# Patient Record
Sex: Female | Born: 1970
Health system: Southern US, Community
[De-identification: ages and names within clinical notes are randomized; demographics above are authoritative.]

## PROBLEM LIST (undated history)

## (undated) DIAGNOSIS — K589 Irritable bowel syndrome without diarrhea: Secondary | ICD-10-CM

## (undated) DIAGNOSIS — K219 Gastro-esophageal reflux disease without esophagitis: Secondary | ICD-10-CM

## (undated) DIAGNOSIS — O24419 Gestational diabetes mellitus in pregnancy, unspecified control: Secondary | ICD-10-CM

## (undated) DIAGNOSIS — N63 Unspecified lump in unspecified breast: Secondary | ICD-10-CM

## (undated) HISTORY — PX: CHOLECYSTECTOMY: SHX55

## (undated) HISTORY — PX: WISDOM TOOTH EXTRACTION: SHX21

## (undated) HISTORY — PX: OTHER SURGICAL HISTORY: SHX169

## (undated) HISTORY — DX: Gestational diabetes mellitus in pregnancy, unspecified control: O24.419

## (undated) HISTORY — DX: Unspecified lump in unspecified breast: N63.0

## (undated) HISTORY — DX: Gastro-esophageal reflux disease without esophagitis: K21.9

## (undated) HISTORY — DX: Irritable bowel syndrome, unspecified: K58.9

---

## 2006-10-02 ENCOUNTER — Ambulatory Visit (HOSPITAL_COMMUNITY): Admission: RE | Admit: 2006-10-02 | Discharge: 2006-10-02 | Payer: Self-pay | Admitting: Unknown Physician Specialty

## 2009-06-16 ENCOUNTER — Other Ambulatory Visit: Admission: RE | Admit: 2009-06-16 | Discharge: 2009-06-16 | Payer: Self-pay | Admitting: Obstetrics and Gynecology

## 2011-06-07 ENCOUNTER — Other Ambulatory Visit: Payer: Self-pay | Admitting: Adult Health

## 2011-06-07 ENCOUNTER — Other Ambulatory Visit (HOSPITAL_COMMUNITY)
Admission: RE | Admit: 2011-06-07 | Discharge: 2011-06-07 | Disposition: A | Discharge status: Home / Self Care | Payer: BC Managed Care – PPO | Source: Ambulatory Visit | Attending: Obstetrics and Gynecology | Admitting: Obstetrics and Gynecology

## 2011-06-07 DIAGNOSIS — Z01419 Encounter for gynecological examination (general) (routine) without abnormal findings: Secondary | ICD-10-CM | POA: Insufficient documentation

## 2011-06-28 ENCOUNTER — Other Ambulatory Visit: Payer: Self-pay | Admitting: Adult Health

## 2011-06-28 ENCOUNTER — Ambulatory Visit (HOSPITAL_COMMUNITY)
Admission: RE | Admit: 2011-06-28 | Discharge: 2011-06-28 | Disposition: A | Discharge status: Home / Self Care | Payer: BC Managed Care – PPO | Source: Ambulatory Visit | Attending: Adult Health | Admitting: Adult Health

## 2011-06-28 DIAGNOSIS — N63 Unspecified lump in unspecified breast: Secondary | ICD-10-CM | POA: Insufficient documentation

## 2012-07-23 ENCOUNTER — Encounter: Payer: Self-pay | Admitting: *Deleted

## 2012-07-23 DIAGNOSIS — N63 Unspecified lump in unspecified breast: Secondary | ICD-10-CM | POA: Insufficient documentation

## 2012-07-23 DIAGNOSIS — O24419 Gestational diabetes mellitus in pregnancy, unspecified control: Secondary | ICD-10-CM | POA: Insufficient documentation

## 2012-07-24 ENCOUNTER — Ambulatory Visit: Payer: BC Managed Care – PPO | Admitting: Advanced Practice Midwife

## 2012-07-24 ENCOUNTER — Encounter: Payer: Self-pay | Admitting: Advanced Practice Midwife

## 2012-07-24 NOTE — Progress Notes (Signed)
Abigail Ingram 41 y.o.

## 2013-04-03 HISTORY — PX: ESOPHAGOGASTRODUODENOSCOPY: SHX1529

## 2013-08-12 ENCOUNTER — Other Ambulatory Visit: Payer: Self-pay | Admitting: Gastroenterology

## 2013-08-12 DIAGNOSIS — R109 Unspecified abdominal pain: Secondary | ICD-10-CM

## 2013-08-14 ENCOUNTER — Ambulatory Visit
Admission: RE | Admit: 2013-08-14 | Discharge: 2013-08-14 | Disposition: A | Discharge status: Home / Self Care | Payer: BC Managed Care – PPO | Source: Ambulatory Visit | Attending: Gastroenterology | Admitting: Gastroenterology

## 2013-08-14 DIAGNOSIS — R109 Unspecified abdominal pain: Secondary | ICD-10-CM

## 2013-08-14 MED ORDER — IOHEXOL 300 MG/ML  SOLN
125.0000 mL | Freq: Once | INTRAMUSCULAR | Status: AC | PRN
  Administered 2013-08-14: 125 mL via INTRAVENOUS

## 2014-02-02 ENCOUNTER — Encounter: Payer: Self-pay | Admitting: Advanced Practice Midwife

## 2014-07-30 ENCOUNTER — Encounter: Payer: Self-pay | Admitting: Women's Health

## 2014-07-30 ENCOUNTER — Ambulatory Visit (INDEPENDENT_AMBULATORY_CARE_PROVIDER_SITE_OTHER): Payer: BLUE CROSS/BLUE SHIELD | Admitting: Women's Health

## 2014-07-30 VITALS — BP 120/64 | HR 68 | Wt 188.0 lb

## 2014-07-30 DIAGNOSIS — Z30433 Encounter for removal and reinsertion of intrauterine contraceptive device: Secondary | ICD-10-CM

## 2014-07-30 DIAGNOSIS — Z30014 Encounter for initial prescription of intrauterine contraceptive device: Secondary | ICD-10-CM | POA: Diagnosis not present

## 2014-07-30 DIAGNOSIS — Z3202 Encounter for pregnancy test, result negative: Secondary | ICD-10-CM

## 2014-07-30 LAB — POCT URINE PREGNANCY: PREG TEST UR: NEGATIVE

## 2014-07-30 NOTE — Progress Notes (Signed)
Abigail Ingram is a 44 y.o. year old G2P2 Caucasian female who presents for removal and replacement of a Mirena IUD. She was given informed consent for removal and reinsertion of her Mirena. Her Mirena was placed 06/2009, No LMP recorded. Patient is not currently having periods (Reason: IUD)., and her pregnancy test today was neg.   The risks and benefits of the method and placement have been thouroughly reviewed with the patient and all questions were answered.  Specifically the patient is aware of failure rate of 04/998, expulsion of the IUD and of possible perforation.  The patient is aware of irregular bleeding due to the method and understands the incidence of irregular bleeding diminishes with time.  Signed copy of informed consent in chart.   No LMP recorded. Patient is not currently having periods (Reason: IUD). BP 120/64 mmHg  Pulse 68  Wt 188 lb (85.276 kg) Results for orders placed or performed in visit on 07/30/14 (from the past 24 hour(s))  POCT urine pregnancy   Collection Time: 07/30/14  1:55 PM  Result Value Ref Range   Preg Test, Ur Negative      Appropriate time out taken. A graves speculum was placed in the vagina.  The cervix was visualized, prepped using Betadine. The strings were visible. They were grasped and the Mirena was easily removed. The cervix was then grasped with a single-tooth tenaculum. The uterus was found to be anteroflexed and it sounded to 7 cm.  Liletta IUD placed per manufacturer's recommendations without complications. The strings were trimmed to 3 cm.  The patient tolerated the procedure well.   Sonogram was performed and the proper placement of the IUD was verified via transvaginal u/s.   The patient was given post procedure instructions, including signs and symptoms of infection and to check for the strings after each menses or each month, and refraining from intercourse or anything in the vagina for 3 days.  She was given a [place] care card with  date Liletta placed, and date Liletta  to be removed.  She is scheduled for a f/u appointment in 4 weeks for IUD check/pap & physical.   Marge DuncansBooker, Kimberly Randall CNM, [facility] 07/30/2014 2:23 PM

## 2014-07-30 NOTE — Patient Instructions (Signed)
 Nothing in vagina for 3 days (no sex, douching, tampons, etc...)  Check your strings once a month to make sure you can feel them, if you are not able to please let us know  If you develop a fever of 100.4 or more in the next few weeks, or if you develop severe abdominal pain, please let us know  Use a backup method of birth control, such as condoms, for 2 weeks   Levonorgestrel intrauterine device (IUD) What is this medicine? LEVONORGESTREL IUD (LEE voe nor jes trel) is a contraceptive (birth control) device. The device is placed inside the uterus by a healthcare professional. It is used to prevent pregnancy and can also be used to treat heavy bleeding that occurs during your period. Depending on the device, it can be used for 3 to 5 years. This medicine may be used for other purposes; ask your health care provider or pharmacist if you have questions. COMMON BRAND NAME(S): LILETTA, Mirena, Skyla What should I tell my health care provider before I take this medicine? They need to know if you have any of these conditions: -abnormal Pap smear -cancer of the breast, uterus, or cervix -diabetes -endometritis -genital or pelvic infection now or in the past -have more than one sexual partner or your partner has more than one partner -heart disease -history of an ectopic or tubal pregnancy -immune system problems -IUD in place -liver disease or tumor -problems with blood clots or take blood-thinners -use intravenous drugs -uterus of unusual shape -vaginal bleeding that has not been explained -an unusual or allergic reaction to levonorgestrel, other hormones, silicone, or polyethylene, medicines, foods, dyes, or preservatives -pregnant or trying to get pregnant -breast-feeding How should I use this medicine? This device is placed inside the uterus by a health care professional. Talk to your pediatrician regarding the use of this medicine in children. Special care may be  needed. Overdosage: If you think you have taken too much of this medicine contact a poison control center or emergency room at once. NOTE: This medicine is only for you. Do not share this medicine with others. What if I miss a dose? This does not apply. What may interact with this medicine? Do not take this medicine with any of the following medications: -amprenavir -bosentan -fosamprenavir This medicine may also interact with the following medications: -aprepitant -barbiturate medicines for inducing sleep or treating seizures -bexarotene -griseofulvin -medicines to treat seizures like carbamazepine, ethotoin, felbamate, oxcarbazepine, phenytoin, topiramate -modafinil -pioglitazone -rifabutin -rifampin -rifapentine -some medicines to treat HIV infection like atazanavir, indinavir, lopinavir, nelfinavir, tipranavir, ritonavir -St. John's wort -warfarin This list may not describe all possible interactions. Give your health care provider a list of all the medicines, herbs, non-prescription drugs, or dietary supplements you use. Also tell them if you smoke, drink alcohol, or use illegal drugs. Some items may interact with your medicine. What should I watch for while using this medicine? Visit your doctor or health care professional for regular check ups. See your doctor if you or your partner has sexual contact with others, becomes HIV positive, or gets a sexual transmitted disease. This product does not protect you against HIV infection (AIDS) or other sexually transmitted diseases. You can check the placement of the IUD yourself by reaching up to the top of your vagina with clean fingers to feel the threads. Do not pull on the threads. It is a good habit to check placement after each menstrual period. Call your doctor right away if you feel   more of the IUD than just the threads or if you cannot feel the threads at all. The IUD may come out by itself. You may become pregnant if the device  comes out. If you notice that the IUD has come out use a backup birth control method like condoms and call your health care provider. Using tampons will not change the position of the IUD and are okay to use during your period. What side effects may I notice from receiving this medicine? Side effects that you should report to your doctor or health care professional as soon as possible: -allergic reactions like skin rash, itching or hives, swelling of the face, lips, or tongue -fever, flu-like symptoms -genital sores -high blood pressure -no menstrual period for 6 weeks during use -pain, swelling, warmth in the leg -pelvic pain or tenderness -severe or sudden headache -signs of pregnancy -stomach cramping -sudden shortness of breath -trouble with balance, talking, or walking -unusual vaginal bleeding, discharge -yellowing of the eyes or skin Side effects that usually do not require medical attention (report to your doctor or health care professional if they continue or are bothersome): -acne -breast pain -change in sex drive or performance -changes in weight -cramping, dizziness, or faintness while the device is being inserted -headache -irregular menstrual bleeding within first 3 to 6 months of use -nausea This list may not describe all possible side effects. Call your doctor for medical advice about side effects. You may report side effects to FDA at 1-800-FDA-1088. Where should I keep my medicine? This does not apply. NOTE: This sheet is a summary. It may not cover all possible information. If you have questions about this medicine, talk to your doctor, pharmacist, or health care provider.  2015, Elsevier/Gold Standard. (2011-04-20 13:54:04)  

## 2014-08-12 ENCOUNTER — Encounter: Payer: Self-pay | Admitting: Adult Health

## 2014-08-26 ENCOUNTER — Other Ambulatory Visit (HOSPITAL_COMMUNITY)
Admission: RE | Admit: 2014-08-26 | Discharge: 2014-08-26 | Disposition: A | Discharge status: Home / Self Care | Payer: BLUE CROSS/BLUE SHIELD | Source: Ambulatory Visit | Attending: Obstetrics & Gynecology | Admitting: Obstetrics & Gynecology

## 2014-08-26 ENCOUNTER — Encounter: Payer: Self-pay | Admitting: Women's Health

## 2014-08-26 ENCOUNTER — Ambulatory Visit (INDEPENDENT_AMBULATORY_CARE_PROVIDER_SITE_OTHER): Payer: BLUE CROSS/BLUE SHIELD | Admitting: Women's Health

## 2014-08-26 VITALS — BP 100/70 | HR 72 | Ht 64.4 in | Wt 185.0 lb

## 2014-08-26 DIAGNOSIS — Z01419 Encounter for gynecological examination (general) (routine) without abnormal findings: Secondary | ICD-10-CM

## 2014-08-26 DIAGNOSIS — Z975 Presence of (intrauterine) contraceptive device: Secondary | ICD-10-CM | POA: Insufficient documentation

## 2014-08-26 DIAGNOSIS — Z1151 Encounter for screening for human papillomavirus (HPV): Secondary | ICD-10-CM | POA: Insufficient documentation

## 2014-08-26 NOTE — Addendum Note (Signed)
Addended by: Shawna ClampBOOKER, Kimberley Dastrup R on: 08/26/2014 11:05 AM   Modules accepted: Orders, Medications

## 2014-08-26 NOTE — Progress Notes (Signed)
Subjective:   Abigail Ingram is a 44 y.o. [id number] Caucasian female here for a routine well-woman exam.  No LMP recorded. Patient is not currently having periods (Reason: IUD).    Current complaints: none PCP: [facility]       Does not desire labs, has had 'full work-up' w/in last year  Social History: Sexual: heterosexual Marital Status: married Living situation: with spouse Occupation: EMT Tobacco/alcohol: no tobacco, occ etoh Illicit drugs: no history of illicit drug use  The following portions of the patient's history were reviewed and updated as appropriate: allergies, current medications, past family history, past medical history, past social history, past surgical history and problem list.  Past Medical History Past Medical History  Diagnosis Date  . Gestational diabetes   . Breast nodule     Right   . IBS (irritable bowel syndrome)   . GERD (gastroesophageal reflux disease)     Past Surgical History Past Surgical History  Procedure Laterality Date  . Kidney valve repair     . Cholecystectomy      Gynecologic History G2P2  No LMP recorded. Patient is not currently having periods (Reason: IUD). Contraception: IUD Last Pap: 2013. Results were: normal Last mammogram: 4/26. Results were: normal Last TCS: never  Obstetric History OB History  Gravida Para Term Preterm AB SAB TAB Ectopic Multiple Living  2 2        2     # Outcome Date GA Lbr Len/2nd Weight Sex Delivery Anes PTL Lv  2 Para         Y  1 Para         Y      Current Medications Current Outpatient Prescriptions on File Prior to Visit  Medication Sig Dispense Refill  . Calcium 200 MG TABS Take by mouth.    . esomeprazole (NEXIUM) 20 MG capsule Take 20 mg by mouth daily at 12 noon.    Marland Kitchen. levonorgestrel (MIRENA) 20 MCG/24HR IUD 1 each by Intrauterine route once.     No current facility-administered medications on file prior to visit.    Review of Systems Patient denies any headaches,  blurred vision, shortness of breath, chest pain, abdominal pain, problems with bowel movements, urination, or intercourse.  Objective:  BP 100/70 mmHg  Pulse 72  Ht 5' 4.4" (1.636 m)  Wt 185 lb (83.915 kg)  BMI 31.35 kg/m2 Physical Exam  General:  Well developed, well nourished, no acute distress. She is alert and oriented x3. Skin:  Warm and dry Neck:  Midline trachea, no thyromegaly or nodules Cardiovascular: Regular rate and rhythm, no murmur heard Lungs:  Effort normal, all lung fields clear to auscultation bilaterally Breasts:  No dominant palpable mass, retraction, or nipple discharge Abdomen:  Soft, non tender, no hepatosplenomegaly or masses Pelvic:  External genitalia is normal in appearance.  The vagina is normal in appearance. The cervix is bulbous, no CMT, Liletta IUD strings visible.  Thin prep pap is done w/ HR HPV cotesting. Uterus is felt to be normal size, shape, and contour.  No adnexal masses or tenderness noted. Extremities:  No swelling or varicosities noted Psych:  She has a normal mood and affect  Assessment:   Healthy well-woman exam IUD check- strings visible  Plan:  F/U 765yr for physical, or sooner if needed Mammogram in [age]yr (just had normal in April), or sooner if problems Colonoscopy @ 44yo or sooner if problems  Marge DuncansBooker, Alonza Knisley Randall CNM, [facility]NP[facility] 08/26/2014 10:39 AM

## 2014-08-26 NOTE — Addendum Note (Signed)
Addended by: Richardson ChiquitoRAVIS, Loyal Holzheimer M on: 08/26/2014 11:25 AM   Modules accepted: Orders

## 2014-08-28 LAB — CYTOLOGY - PAP

## 2016-05-06 IMAGING — CT CT ABDOMEN W/ CM
2 of 5 series · 17 of 46 positions shown, 19 images · IV contrast (READICAT/WATER & [ID] OMNI 300)
Comparison: No priors.

CLINICAL DATA: Pain below the ribs bilaterally for the past 5
months increasing in severity. Nausea.

EXAM:
CT ABDOMEN WITH CONTRAST
TECHNIQUE: Multidetector CT imaging of the abdomen was performed using the
standard protocol following bolus administration of intravenous
contrast.
CONTRAST:  125mL OMNIPAQUE IOHEXOL 300 MG/ML  SOLN

[Series 2: abdomen w/ · axial · 0.84mm/px · z∈[-298,-8]mm · 14 of 68 slices shown, 16 images]
[im 5/68  soft-tissue]
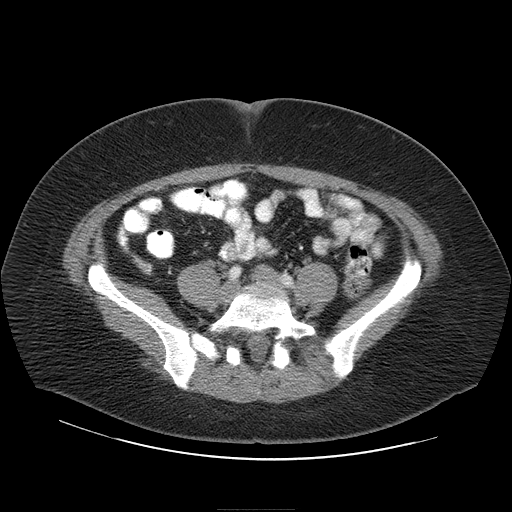
[im 5/68  bone]
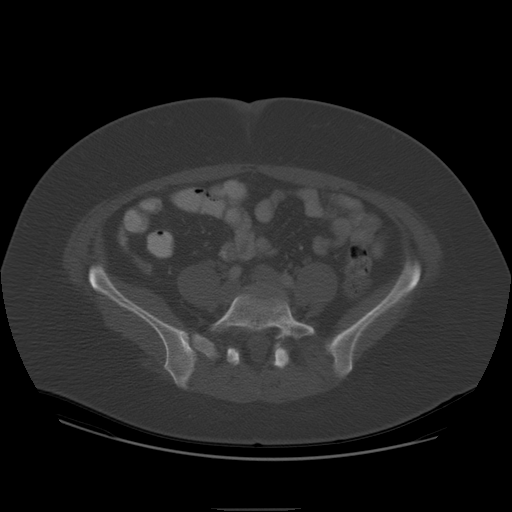
[im 9/68  soft-tissue]
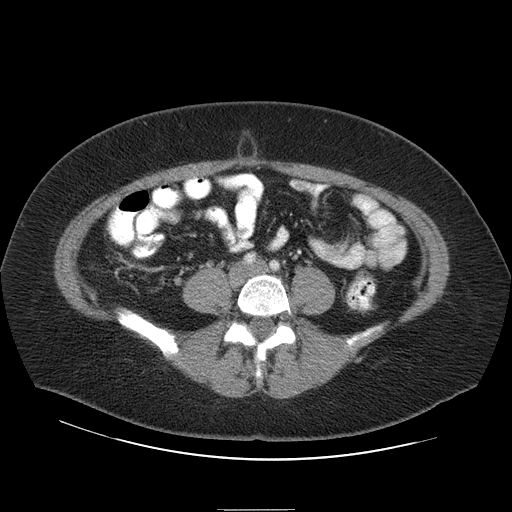
[im 13/68  soft-tissue]
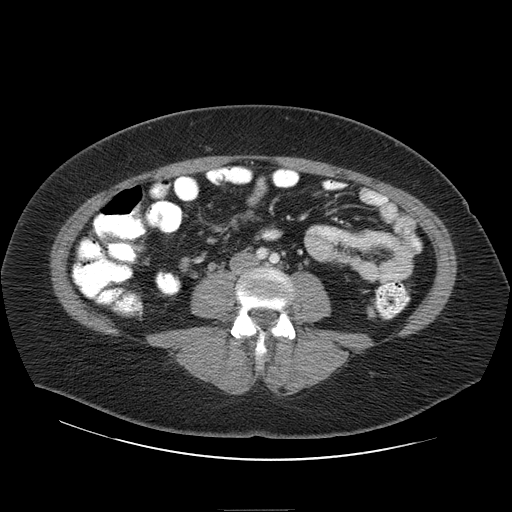
[im 17/68  soft-tissue]
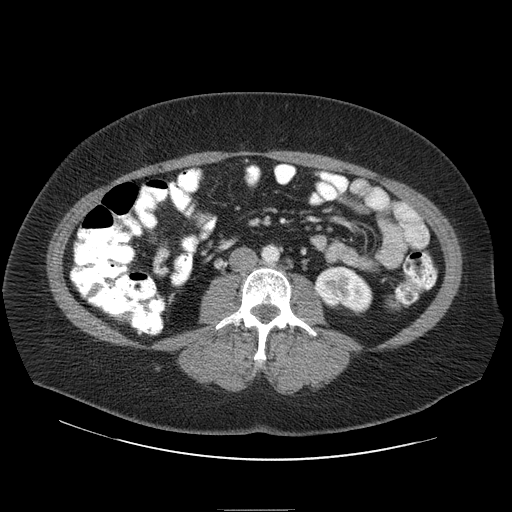
[im 21/68  soft-tissue]
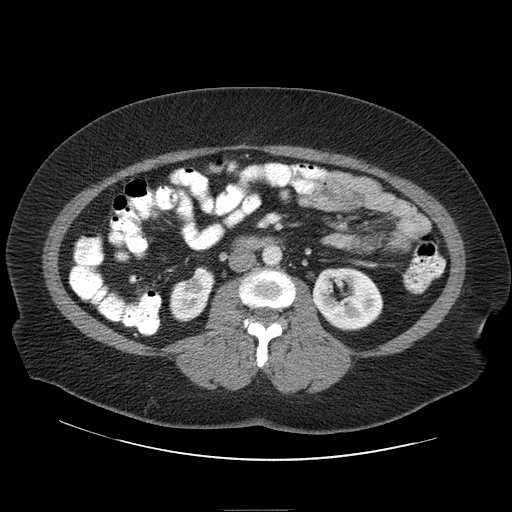
[im 26/68  soft-tissue]
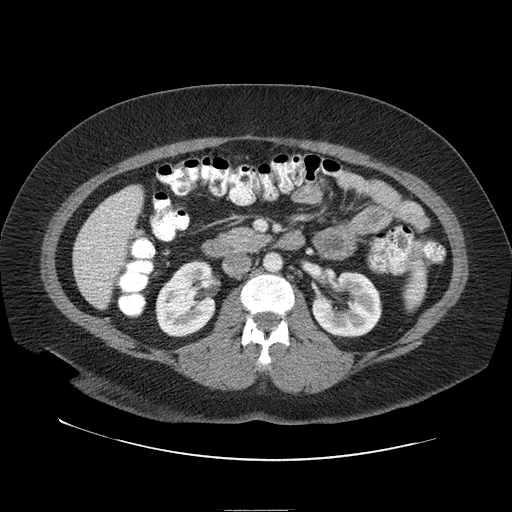
[im 30/68  soft-tissue]
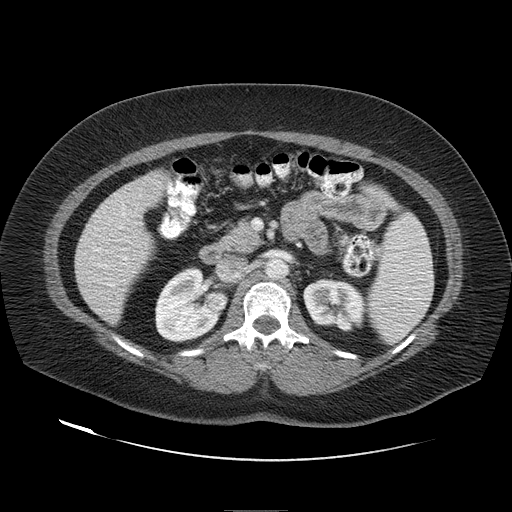
[im 38/68  soft-tissue]
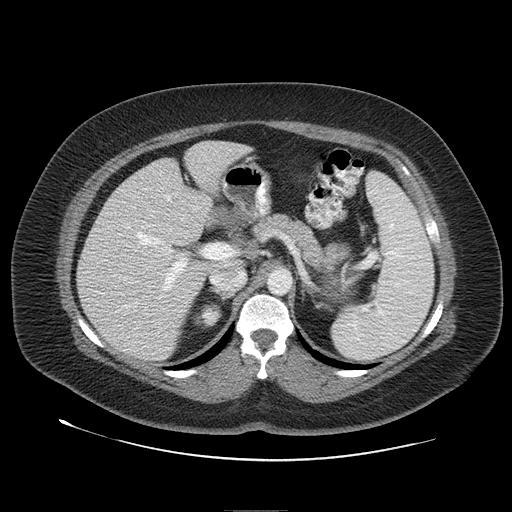
[im 42/68  soft-tissue]
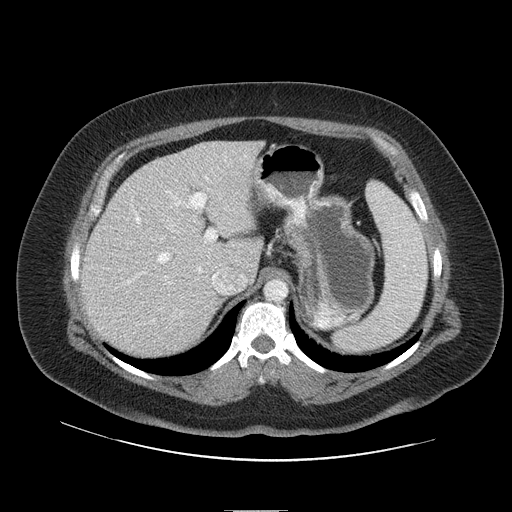
[im 42/68  bone]
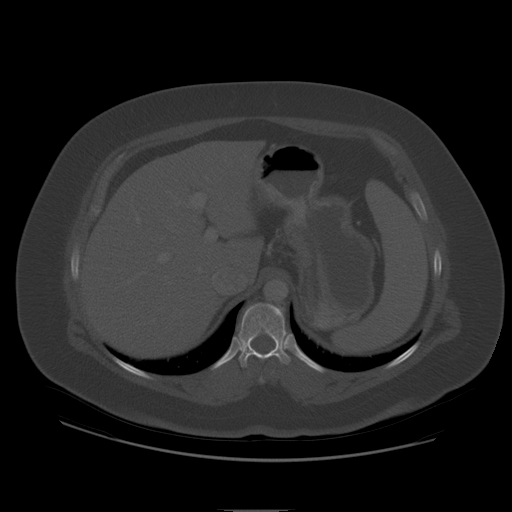
[im 47/68  soft-tissue]
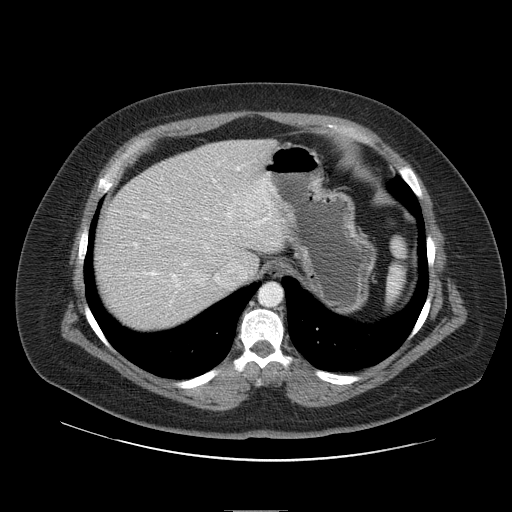
[im 51/68  soft-tissue]
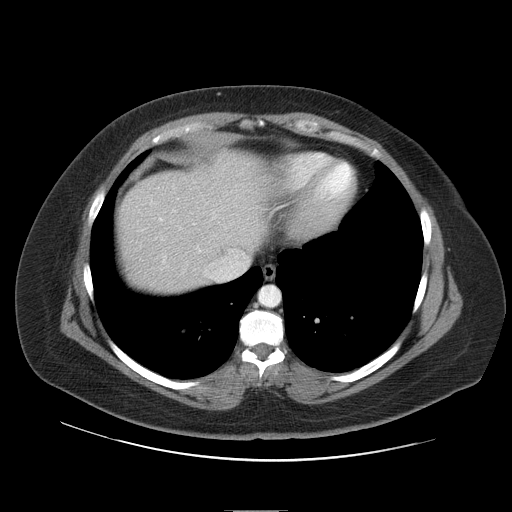
[im 55/68  soft-tissue]
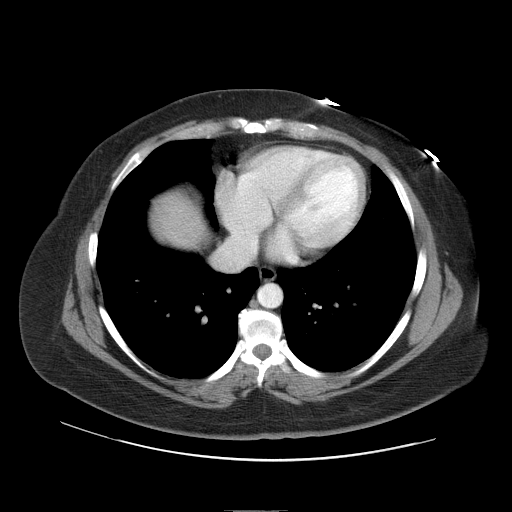
[im 59/68  soft-tissue]
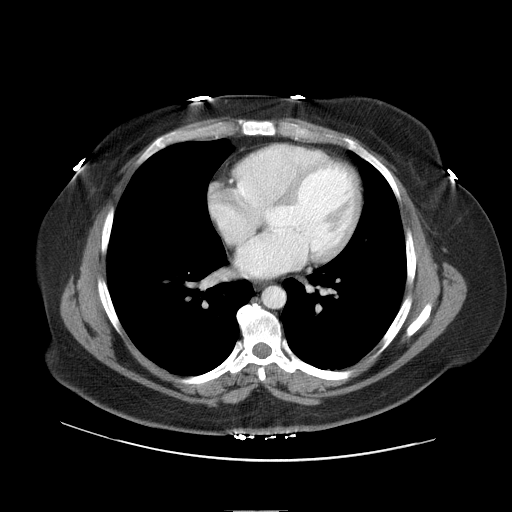
[im 63/68  soft-tissue]
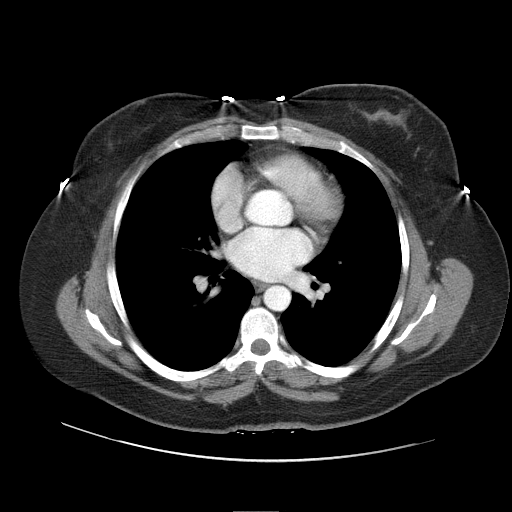

[Series 400: cor · coronal · 0.84mm/px · 3 of 131 slices shown]
[im 44/131  soft-tissue]
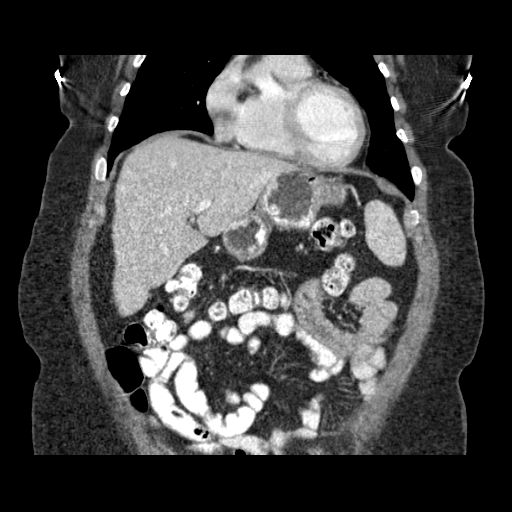
[im 58/131  soft-tissue]
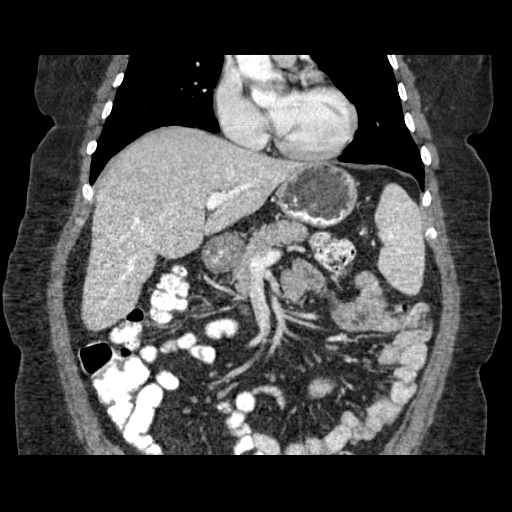
[im 73/131  soft-tissue]
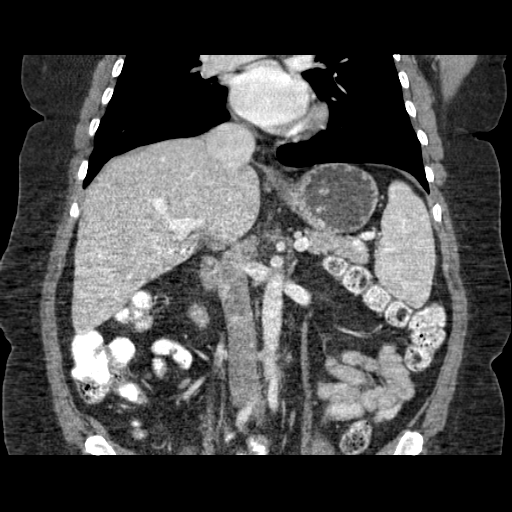

[17 of 46 positions shown; findings below may reference images not displayed]

FINDINGS: Lung Bases: Unremarkable.

Abdomen: Status post cholecystectomy. The appearance of the liver,
pancreas and bilateral adrenal glands is unremarkable. There is some
mild multifocal cortical thinning in the kidneys bilaterally,
presumably scarring from prior infection/inflammation. Spleen is
enlarged measuring 16.3 x 7.8 x 11.6 cm (estimated splenic volume of
737 mL). Normal appendix. Within the visualized portions of the
peritoneal cavity there is no significant volume of ascites, no
pneumoperitoneum and no pathologic distention of small bowel. No
lymphadenopathy.

Musculoskeletal: Bilateral pars defects at L5 with 5 mm of
anterolisthesis of L5 upon S1. There are no aggressive appearing
lytic or blastic lesions noted in the visualized portions of the
skeleton.
IMPRESSION: 1. Splenomegaly, as above.
2. No acute findings to account for the patient's symptoms.
3. Status post cholecystectomy.
4. Normal appendix.
5. Multifocal scarring in the kidneys bilaterally presumably related
to prior episodes of infection/inflammation.
6. Grade 1 spondylolisthesis of L5 upon S1.

## 2016-06-05 ENCOUNTER — Encounter: Payer: Self-pay | Admitting: Gastroenterology

## 2016-06-21 ENCOUNTER — Encounter: Payer: Self-pay | Admitting: Nurse Practitioner

## 2016-06-21 ENCOUNTER — Ambulatory Visit (INDEPENDENT_AMBULATORY_CARE_PROVIDER_SITE_OTHER): Payer: BLUE CROSS/BLUE SHIELD | Admitting: Nurse Practitioner

## 2016-06-21 DIAGNOSIS — R197 Diarrhea, unspecified: Secondary | ICD-10-CM | POA: Diagnosis not present

## 2016-06-21 DIAGNOSIS — R109 Unspecified abdominal pain: Secondary | ICD-10-CM

## 2016-06-21 DIAGNOSIS — K219 Gastro-esophageal reflux disease without esophagitis: Secondary | ICD-10-CM

## 2016-06-21 MED ORDER — DEXLANSOPRAZOLE 60 MG PO CPDR: 60.0000 mg | DELAYED_RELEASE_CAPSULE | Freq: Every day | ORAL | 0 refills | Status: DC

## 2016-06-21 MED ORDER — SUCRALFATE 1 G PO TABS: 1.0000 g | ORAL_TABLET | Freq: Three times a day (TID) | ORAL | 1 refills | Status: DC

## 2016-06-21 NOTE — Assessment & Plan Note (Signed)
The patient has chronic GERD. Was previously on omeprazole 20 mg and omeprazole 40 mg. This is discontinued. She is now on over-the-counter Nexium 40 mg. Per the patient she had an upper endoscopy which showed significant esophagitis. [facility] She started taking Carafate and feels that this is helped significantly and is specifically requesting a refill of Carafate. At this point I will have her stop Nexium, start Dexilant for which I will give her samples. I will send in Carafate per her request. Call if worsening symptoms, return for follow-up in 6 weeks.

## 2016-06-21 NOTE — Assessment & Plan Note (Signed)
The patient notes lower abdominal cramping in addition to diarrhea. This all started in January. She wonders if she has IBS although she has typically been normal stools to constipation for the majority of her life. Her diarrhea seems to be resolving as she is now having soft stools are not watery/loose stools. Abdominal cramping is improving as well. Her symptoms are more likely indicative of a live self-limited viral gastroenteritis rather than ongoing irritable bowel syndrome diarrhea type. I will have her continue her current medications and monitor her symptoms. She is to call us if she has worsening abdominal pain or diarrhea. Return for follow-up in 6 weeks for symptom progression.

## 2016-06-21 NOTE — Patient Instructions (Signed)
1. Stop taking over-the-counter Nexium for now. 2. Start taking Dexilant once a day, on an empty stomach. We will provide you samples to last a couple weeks. 3. Call us in 1-2 weeks and let us know if the Dexilant is helping your acid reflux symptoms. 4. I sent in Carafate to your pharmacy for any breakthrough acid reflux. 5. Call notify us if you have any worsening cramping in her lower abdomen or returning diarrhea. 6. Otherwise, return for follow-up in 6 weeks.

## 2016-06-21 NOTE — Progress Notes (Signed)
cc'ed to pcp °

## 2016-06-21 NOTE — Assessment & Plan Note (Signed)
The patient has epigastric pain, likely due to her GERD and possibly esophagitis. She also has been having some lower abdominal cramping in addition to her diarrhea. Bowel movements initially did not help her cramping. She feels she may have IBS. She is under significant stress. Her symptoms started in early to mid January and at this point seem to be improving. States lower abdominal cramping is significantly better. Return for follow-up in 6 weeks. Call if worsening symptoms. More likely viral gastroenteritis rather than prolonged/ongoing irritable bowel syndrome diarrhea type.

## 2016-06-21 NOTE — Progress Notes (Signed)
Primary Care Physician:  Cassell SmilesFUSCO,LAWRENCE J., MD Primary Gastroenterologist:  Dr. Darrick PennaFields  Chief Complaint  Patient presents with  . Gastroesophageal Reflux  . Abdominal Pain    HPI:   Abigail Ingram is a 46 y.o. female who presents On referral from primary care for GERD and abnormal stools. PCP notes reviewed, last saw primary care on 05/25/2016. At that time the patient noted abdominal pain and heartburn. Had previously been diagnosed with IBS as well and on 04/23/2016 she began having watery diarrhea and lower abdominal pain which continued as of the time of PCP evaluation. She was currently on omeprazole 40 mg daily at that time. Plan included discussing stress management and encouraging more frequent yoga.  Today she states she's doing somewhat better. Symptoms started about 3 years ago in January and attributed to stress. States she has a "nervous stomach." She was having epigastric pain, tested for H. Pylori which was negative. Was on Prilosec and increased it to twice a day. Had EGD completed and [facility] Was told to stop Prilosec and started OTC Nexium, which helped. Has abdominal pain near umbilicus and MRI was completed and told it was normal, diagnosed with IBS. Was started on a medication which cause severe constipation. She made dietary changes including limiting caffeine, fatty foods, spicy foods; avoids processed foods (which she feels typically causes exacerbation of her symptoms.) Symptoms were doing well, other than when stressed. Early January began with water diarrhea, took Immodium 1-2 doses. This diarrhea was associated with worsening lower abdominal crampy pain. Has been having crampy pain with significant and large amount of soft to loose stools.   Started Carafate recently (took her father's Carafate) and has done somewhat better. Past couple days has had more of a normal bowel movement.  Typically has had more constipation in her life than  diarrhea.  Past Medical History:  Diagnosis Date  . Breast nodule    Right   . GERD (gastroesophageal reflux disease)   . Gestational diabetes   . IBS (irritable bowel syndrome)     Past Surgical History:  Procedure Laterality Date  . CHOLECYSTECTOMY    . ESOPHAGOGASTRODUODENOSCOPY    . kidney valve repair       Current Outpatient Prescriptions  Medication Sig Dispense Refill  . Calcium 200 MG TABS Take by mouth.    . esomeprazole (NEXIUM) 20 MG capsule Take 20 mg by mouth daily at 12 noon.     No current facility-administered medications for this visit.     Allergies as of 06/21/2016  . (No Known Allergies)    Family History  Problem Relation Age of Onset  . Cancer Other     breast  . Deep vein thrombosis Mother     + for inherited clotting disorder, on coumadin  . Cancer Father     skin  . Cancer Sister     skin  . Cancer Maternal Grandmother     colon   . Cancer Paternal Grandmother     skin  . Diabetes Other   . Hypertension Other   . Stroke Other   . Mental illness Other     Social History   Social History  . Marital status: Married    Spouse name: N/A  . Number of children: N/A  . Years of education: N/A   Occupational History  . Not on file.   Social History Main Topics  . Smoking status: Never Smoker  . Smokeless tobacco:  Not on file  . Alcohol use Yes     Comment: ocassional  . Drug use: No  . Sexual activity: Yes    Birth control/ protection: IUD   Other Topics Concern  . Not on file   Social History Narrative  . No narrative on file    Review of Systems: Complete ROS negative except as per HPI.   Physical Exam: BP 130/79   Pulse (!) 58   Temp 98.1 F (36.7 C) (Oral)   Ht 5\' 4"  (1.626 m)   Wt 192 lb 12.8 oz (87.5 kg)   BMI 33.09 kg/m  General:   Overweight female. Alert and oriented. Pleasant and cooperative. Well-nourished and well-developed.  Head:  Normocephalic and atraumatic. Eyes:  Without icterus, sclera  clear and conjunctiva pink.  Ears:  Normal auditory acuity. Cardiovascular:  S1, S2 present without murmurs appreciated. Normal pulses noted. Extremities without clubbing or edema. Respiratory:  Clear to auscultation bilaterally. No wheezes, rales, or rhonchi. No distress.  Gastrointestinal:  +BS, soft, non-tender and non-distended. No HSM noted. No guarding or rebound. No masses appreciated.  Rectal:  Deferred  Musculoskalatal:  Symmetrical without gross deformities. Skin:  Intact without significant lesions or rashes. Neurologic:  Alert and oriented x4;  grossly normal neurologically. Psych:  Alert and cooperative. Normal mood and affect. Heme/Lymph/Immune: No excessive bruising noted.    06/21/2016 11:25 AM   Disclaimer: This note was dictated with voice recognition software. Similar sounding words can inadvertently be transcribed and may not be corrected upon review.

## 2016-06-26 ENCOUNTER — Encounter: Payer: Self-pay | Admitting: Women's Health

## 2016-06-26 ENCOUNTER — Ambulatory Visit (INDEPENDENT_AMBULATORY_CARE_PROVIDER_SITE_OTHER): Payer: BLUE CROSS/BLUE SHIELD | Admitting: Women's Health

## 2016-06-26 VITALS — BP 122/64 | HR 60 | Ht 64.25 in | Wt 194.0 lb

## 2016-06-26 DIAGNOSIS — Z01419 Encounter for gynecological examination (general) (routine) without abnormal findings: Secondary | ICD-10-CM
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
## 2016-06-26 NOTE — Patient Instructions (Signed)
Tips to Help You Sleep Better:  ?Get into a bedtime routine, try to do the same thing every night before going to bed to try to help your body wind down ?Warm baths ?Avoid caffeine for at least 3 hours before going to sleep  ?Keep your room at a slightly cooler temperature, can try running a fan ?Turn off TV, lights, phone, electronics ?Lots of pillows if needed to help you get comfortable ?Lavender scented items can help you sleep. You can place lavender essential oil on a cotton ball and place under your pillowcase, or place in a diffuser. Febreeze has a lavender scented sleep line (plug-ins, sprays, etc). Look in the pillow aisle for lavender scented pillows.  ?If none of the above things help, you can try 1/2 to 1 tablet of benadryl, unisom, or tylenol pm. Do not take this every night, only when you really need it.   ?

## 2016-07-19 NOTE — Progress Notes (Signed)
REVIEWED-NO ADDITIONAL RECOMMENDATIONS. 

## 2016-07-27 ENCOUNTER — Telehealth: Payer: Self-pay | Admitting: Gastroenterology

## 2016-07-27 NOTE — Telephone Encounter (Signed)
Noted - no further recommendations at this time.

## 2016-07-27 NOTE — Telephone Encounter (Signed)
PATIENT CALLED WITH HER UPDATE AND SHE IS DOING OK AND HAS CUT BACK ON NEXIUM AND TAKING CARAFATE 1-2 TIMES A DAY

## 2016-08-03 ENCOUNTER — Ambulatory Visit: Payer: BLUE CROSS/BLUE SHIELD | Admitting: Nurse Practitioner

## 2016-09-05 ENCOUNTER — Encounter: Payer: Self-pay | Admitting: Nurse Practitioner

## 2016-09-05 ENCOUNTER — Ambulatory Visit (INDEPENDENT_AMBULATORY_CARE_PROVIDER_SITE_OTHER): Payer: BLUE CROSS/BLUE SHIELD | Admitting: Nurse Practitioner

## 2016-09-05 VITALS — BP 133/86 | HR 63 | Temp 97.3°F | Ht 64.5 in | Wt 191.6 lb

## 2016-09-05 DIAGNOSIS — K219 Gastro-esophageal reflux disease without esophagitis: Secondary | ICD-10-CM

## 2016-09-05 DIAGNOSIS — R109 Unspecified abdominal pain: Secondary | ICD-10-CM

## 2016-09-05 DIAGNOSIS — R197 Diarrhea, unspecified: Secondary | ICD-10-CM | POA: Diagnosis not present

## 2016-09-05 MED ORDER — SUCRALFATE 1 G PO TABS: 1.0000 g | ORAL_TABLET | Freq: Three times a day (TID) | ORAL | 3 refills | Status: DC

## 2016-09-05 NOTE — Progress Notes (Signed)
cc'ed to pcp °

## 2016-09-05 NOTE — Assessment & Plan Note (Signed)
Lower abdominal pain significantly improved/resolved at this point. She may have further symptoms during periods of increased stress given likely IBS D. Epigastric pain is significantly improved on Carafate and Nexium over-the-counter. Recommend continue current medications, return for follow-up in 6 months. Call before then if she has significant worsening.

## 2016-09-05 NOTE — Progress Notes (Signed)
Referring Provider: Elfredia NevinsFusco, Lawrence, MD Primary Care Physician:  Elfredia NevinsFusco, Lawrence, MD Primary GI:  Dr. Darrick PennaFields  Chief Complaint  Patient presents with  . Gastroesophageal Reflux    better  . Abdominal Pain    epigastric, not as severe, usually everyday  . Diarrhea    rare    HPI:   Abigail Ingram is a 46 y.o. female who presents for follow-up on GERD, abdominal pain, diarrhea. The patient was last seen in our office 06/21/2016. Noted at her last visit that she had previously been diagnosed with IBS and chronic GERD. At her last visit she was somewhat improved with symptoms starting 3 years prior and attributed to stress. [facility] and noted esophagitis and was started on Nexium which had helped. Abdominal MRI normal. Dietary changes were made, stress management discussed. When she does get stressed she begins having watery diarrhea and Imodium tends to be her treatment of choice. Carafate has helped. She was started on Dexilant and requested 1-2 week progress report, Carafate was sent into the pharmacy as well. Her main complaints at her last visit was more epigastric pain and GERD. Lower abdominal pain had improved. She called back with a progress report as requested but noted she has cut back on her Nexium and was taking Carafate one to 2 times a day.  Today she states she's doing well. Diarrhea is rare as is lower abdominal pain. Has cut back on Nexium given Carafate. She has had some increase in symptoms recently due to significant stress with her father-in-law passing away and her mom getting diagnosed with lung cancer. She states she prefers carafate over PPI because her IBS symptoms have been helped by Carafate; she takes it about 1-2 times a day. Takes Nexium about once a week. Denies nausea, changes in bowel habits, hematochezia, melena. Still has significant mucus in her stools which is increased with her pain and with stress. Denies chest pain, dyspnea,  dizziness, lightheadedness, syncope, near syncope. Denies any other upper or lower GI symptoms.  Past Medical History:  Diagnosis Date  . Breast nodule    Right   . GERD (gastroesophageal reflux disease)   . Gestational diabetes   . IBS (irritable bowel syndrome)     Past Surgical History:  Procedure Laterality Date  . CHOLECYSTECTOMY    . ESOPHAGOGASTRODUODENOSCOPY  2015  . kidney valve repair       Current Outpatient Prescriptions  Medication Sig Dispense Refill  . esomeprazole (NEXIUM) 20 MG capsule Take 20 mg by mouth as needed.    . sucralfate (CARAFATE) 1 g tablet Take 1 tablet (1 g total) by mouth 4 (four) times daily -  with meals and at bedtime. 60 tablet 3   No current facility-administered medications for this visit.     Allergies as of 09/05/2016  . (No Known Allergies)    Family History  Problem Relation Age of Onset  . Cancer Other        breast  . Deep vein thrombosis Mother        + for inherited clotting disorder, on coumadin  . Cancer Father        skin  . Cancer Sister        skin  . Colon cancer Maternal Grandmother        colon   . Cancer Paternal Grandmother        skin  . Diabetes Other   . Hypertension Other   .  Stroke Other   . Mental illness Other     Social History   Social History  . Marital status: Married    Spouse name: N/A  . Number of children: N/A  . Years of education: N/A   Social History Main Topics  . Smoking status: Never Smoker  . Smokeless tobacco: Never Used  . Alcohol use Yes     Comment: ocassional  . Drug use: No  . Sexual activity: Yes    Birth control/ protection: IUD   Other Topics Concern  . None   Social History Narrative  . None    Review of Systems: Complete ROS negative except as per HPI.  Physical Exam: BP 133/86   Pulse 63   Temp 97.3 F (36.3 C) (Oral)   Ht 5' 4.5" (1.638 m)   Wt 191 lb 9.6 oz (86.9 kg)   BMI 32.38 kg/m  General:   Alert and oriented. Pleasant and cooperative.  Well-nourished and well-developed.  Head:  Normocephalic and atraumatic. Eyes:  Without icterus, sclera clear and conjunctiva pink.  Ears:  Normal auditory acuity. Cardiovascular:  S1, S2 present without murmurs appreciated. Extremities without clubbing or edema. Respiratory:  Clear to auscultation bilaterally. No wheezes, rales, or rhonchi. No distress.  Gastrointestinal:  +BS, rounded but soft, non-tender and non-distended. No HSM noted. No guarding or rebound. No masses appreciated.  Rectal:  Deferred  Musculoskalatal:  Symmetrical without gross deformities. Neurologic:  Alert and oriented x4;  grossly normal neurologically. Psych:  Alert and cooperative. Normal mood and affect. Heme/Lymph/Immune: No excessive bruising noted.    09/05/2016 9:10 AM   Disclaimer: This note was dictated with voice recognition software. Similar sounding words can inadvertently be transcribed and may not be corrected upon review.

## 2016-09-05 NOTE — Assessment & Plan Note (Signed)
Diarrhea significantly improved, rarely occurs. Typical with IBS diarrhea-type she has worsening symptoms during periods of increased stress. She is working on Optician, dispensingstress management. She can continue Imodium as needed as this tends to work well for her. Further management of GERD as per above. Return for follow-up in 6 months, call if worsening before then.

## 2016-09-05 NOTE — Patient Instructions (Signed)
1. Continue taking her current medications. 2. I send in a refill of Carafate to your pharmacy. 3. Return for follow-up in 6 months. 4. Call us if you have any severe or worsening symptoms before then.

## 2016-09-05 NOTE — Assessment & Plan Note (Signed)
GERD symptoms significantly improved. She prefers Carafate as a tends to control her symptoms in addition to her IBS symptoms, per her thoughts. Is taking Carafate one to 2 times a day, is down to Nexium over-the-counter about once a week. I have discussed it is usually recommended to take PPI more regular than Carafate as a rescue medicine but she again prefers Carafate. Continue current medications, return for follow-up in 6 months, call if any significant worsening before then.

## 2016-11-05 ENCOUNTER — Other Ambulatory Visit: Payer: Self-pay | Admitting: Nurse Practitioner

## 2016-11-05 DIAGNOSIS — K219 Gastro-esophageal reflux disease without esophagitis: Secondary | ICD-10-CM

## 2016-11-05 DIAGNOSIS — R109 Unspecified abdominal pain: Secondary | ICD-10-CM

## 2016-11-05 DIAGNOSIS — R197 Diarrhea, unspecified: Secondary | ICD-10-CM

## 2016-11-17 NOTE — Progress Notes (Signed)
REVIEWED-NO ADDITIONAL RECOMMENDATIONS. 

## 2017-02-14 ENCOUNTER — Encounter: Payer: Self-pay | Admitting: Gastroenterology

## 2017-06-25 ENCOUNTER — Encounter: Payer: Self-pay | Admitting: Nurse Practitioner

## 2017-06-25 ENCOUNTER — Ambulatory Visit (INDEPENDENT_AMBULATORY_CARE_PROVIDER_SITE_OTHER): Payer: BLUE CROSS/BLUE SHIELD | Admitting: Nurse Practitioner

## 2017-06-25 VITALS — BP 134/84 | HR 53 | Temp 98.7°F | Ht 64.5 in | Wt 196.2 lb

## 2017-06-25 DIAGNOSIS — R197 Diarrhea, unspecified: Secondary | ICD-10-CM | POA: Diagnosis not present

## 2017-06-25 DIAGNOSIS — K219 Gastro-esophageal reflux disease without esophagitis: Secondary | ICD-10-CM

## 2017-06-25 DIAGNOSIS — R109 Unspecified abdominal pain: Secondary | ICD-10-CM | POA: Diagnosis not present

## 2017-06-25 MED ORDER — SUCRALFATE 1 G PO TABS: ORAL_TABLET | ORAL | 0 refills | Status: DC

## 2017-06-25 NOTE — Assessment & Plan Note (Signed)
The patient does occasionally have lower abdominal pain likely related to IBS.  Her stools are doing well currently.  She takes Imodium if needed for diarrhea.  Recommend continue current medications, follow-up in 2 months.

## 2017-06-25 NOTE — Assessment & Plan Note (Signed)
She states that overall her GERD symptoms are much improved compared to when she first started seeing us.  However, she does take Nexium daily and Carafate daily.  She has regular breakthrough, sometimes as often as once a day.  At this point I will have her hold Nexium, start Dexilant samples 60 mg once a day.  She is to call us in 1-2 weeks and let us know if it helps.  She is asking for refill of her Carafate and I will do that.  Overall, improved but not optimal.  Follow-up in 2 months.

## 2017-06-25 NOTE — Patient Instructions (Signed)
1. I have sent in a refill of your Carafate to the pharmacy. 2. Stop taking Nexium for now. 3. I am giving you samples of Dexilant 60 mg.  Take this once a day, 30 minutes before your first meal the day. 4. Call us in 1-2 weeks and let us know if it is helping. 5. Follow-up in 2 months. 6. Call us if you have any questions or concerns.   Have a great start of spring/summer!!    At [facility] we value your feedback. You may receive a survey about your visit today. Please share your experience as we strive to create trusing relationships with our patients to provide genuine, compassionate, quality care.

## 2017-06-25 NOTE — Progress Notes (Signed)
Referring Provider: Elfredia NevinsFusco, Lawrence, MD Primary Care Physician:  Elfredia NevinsFusco, Lawrence, MD Primary GI:  Dr. Darrick PennaFields  Chief Complaint  Patient presents with  . Gastroesophageal Reflux    HPI:   Abigail Ingram is a 47 y.o. female who presents for follow-up on reflux and IBS.  The patient was last seen in our office 09/05/2016 for GERD, abdominal pain, diarrhea.  [place] and was started on Nexium.  History of GERD.  History of diarrhea with Imodium helping.  At her last office visit she was doing well.  Rare diarrhea with abdominal pain.  Has cut back on Nexium and taking Carafate.  Recently increased symptoms due to stress from a death in the family and cancer diagnosis in the family.  Carafate also helps her IBS symptoms.  No other GI symptoms at that time.  Recommend continue current medications, Carafate refilled, follow-up in 6 months.  Today she states she's doing ok overall. Has had a couple spells of IBS. GERD is intermittent. Alternates between Nexium and Carafate. Typically takes Nexium once daily. Uses Carafate about 1 daily (2 at most). Occasional nausea, ginger root helps. Uses peppermint oil for middle of the night bitter reflux taste. Overall her symptoms are much improved compared to what they were. If she has a breakthrough, drinking water helps. Avoiding fried, spicy, processed foods. Previously tried/failed omeprazole. Dexilant in her chart, cannot remember if it helped. Only trigger she can think of is she still drinks coffee once a day. denies hematochezia, melena, vomiting, fever, chills, unintentional weight loss. Denies chest pain, dyspnea, dizziness, lightheadedness, syncope, near syncope. Denies any other upper or lower GI symptoms.  Past Medical History:  Diagnosis Date  . Breast nodule    Right   . GERD (gastroesophageal reflux disease)   . Gestational diabetes   . IBS (irritable bowel syndrome)     Past Surgical  History:  Procedure Laterality Date  . CHOLECYSTECTOMY    . ESOPHAGOGASTRODUODENOSCOPY  2015  . kidney valve repair       Current Outpatient Medications  Medication Sig Dispense Refill  . esomeprazole (NEXIUM) 20 MG capsule Take 20 mg by mouth as needed.    . Ginger, Zingiber officinalis, (GINGER ROOT PO) Take by mouth as needed.    Marland Kitchen. OVER THE COUNTER MEDICATION Calcium by mouth once a day (unsure of dose)    . sucralfate (CARAFATE) 1 g tablet TAKE 1 TABLET(1 GRAM) BY MOUTH FOUR TIMES DAILY WITH MEALS AND AT BEDTIME 60 tablet 0   No current facility-administered medications for this visit.     Allergies as of 06/25/2017  . (No Known Allergies)    Family History  Problem Relation Age of Onset  . Cancer Other        breast  . Deep vein thrombosis Mother        + for inherited clotting disorder, on coumadin  . Cancer Father        skin  . Cancer Sister        skin  . Colon cancer Maternal Grandmother        colon   . Cancer Paternal Grandmother        skin  . Diabetes Other   . Hypertension Other   . Stroke Other   . Mental illness Other     Social History   Socioeconomic History  . Marital status: Married    Spouse name: Not on file  . Number  of children: Not on file  . Years of education: Not on file  . Highest education level: Not on file  Occupational History  . Not on file  Social Needs  . Financial resource strain: Not on file  . Food insecurity:    Worry: Not on file    Inability: Not on file  . Transportation needs:    Medical: Not on file    Non-medical: Not on file  Tobacco Use  . Smoking status: Never Smoker  . Smokeless tobacco: Never Used  Substance and Sexual Activity  . Alcohol use: Yes    Comment: ocassional  . Drug use: No  . Sexual activity: Yes    Birth control/protection: IUD  Lifestyle  . Physical activity:    Days per week: Not on file    Minutes per session: Not on file  . Stress: Not on file  Relationships  . Social  connections:    Talks on phone: Not on file    Gets together: Not on file    Attends religious service: Not on file    Active member of club or organization: Not on file    Attends meetings of clubs or organizations: Not on file    Relationship status: Not on file  Other Topics Concern  . Not on file  Social History Narrative  . Not on file    Review of Systems: Complete ROS negative except as per HPI.   Physical Exam: BP 134/84   Pulse (!) 53   Temp 98.7 F (37.1 C) (Oral)   Ht 5' 4.5" (1.638 m)   Wt 196 lb 3.2 oz (89 kg)   BMI 33.16 kg/m  General:   Alert and oriented. Pleasant and cooperative. Well-nourished and well-developed.  Eyes:  Without icterus, sclera clear and conjunctiva pink.  Ears:  Normal auditory acuity. Cardiovascular:  S1, S2 present without murmurs appreciated. Extremities without clubbing or edema. Respiratory:  Clear to auscultation bilaterally. No wheezes, rales, or rhonchi. No distress.  Gastrointestinal:  +BS, soft, non-tender and non-distended. No HSM noted. No guarding or rebound. No masses appreciated.  Rectal:  Deferred  Musculoskalatal:  Symmetrical without gross deformities. Neurologic:  Alert and oriented x4;  grossly normal neurologically. Psych:  Alert and cooperative. Normal mood and affect. Heme/Lymph/Immune: No excessive bruising noted.    06/25/2017 9:52 AM   Disclaimer: This note was dictated with voice recognition software. Similar sounding words can inadvertently be transcribed and may not be corrected upon review.

## 2017-09-06 ENCOUNTER — Telehealth: Payer: Self-pay | Admitting: Adult Health

## 2017-09-06 DIAGNOSIS — R928 Other abnormal and inconclusive findings on diagnostic imaging of breast: Secondary | ICD-10-CM

## 2017-09-06 NOTE — Telephone Encounter (Signed)
Patient states she needs a diagnostic mammogram but needs orders placed. This is for right breast asymmetry f/u.

## 2017-09-06 NOTE — Telephone Encounter (Signed)
Patient called stating that she would like a call back from Jennifer. Patient did not state the reason for the call. Please contact pt 

## 2017-09-06 NOTE — Telephone Encounter (Signed)
Diagnostic bilateral mammogram and US scheduled for 6/19 at 9:20 am at [facility], be there at 9 am, pt aware

## 2017-09-07 ENCOUNTER — Other Ambulatory Visit: Payer: Self-pay | Admitting: Nurse Practitioner

## 2017-09-07 DIAGNOSIS — R197 Diarrhea, unspecified: Secondary | ICD-10-CM

## 2017-09-07 DIAGNOSIS — R109 Unspecified abdominal pain: Secondary | ICD-10-CM

## 2017-09-07 DIAGNOSIS — K219 Gastro-esophageal reflux disease without esophagitis: Secondary | ICD-10-CM

## 2017-09-18 ENCOUNTER — Ambulatory Visit (HOSPITAL_COMMUNITY)
Admission: RE | Admit: 2017-09-18 | Discharge: 2017-09-18 | Disposition: A | Discharge status: Home / Self Care | Payer: BLUE CROSS/BLUE SHIELD | Source: Ambulatory Visit | Attending: Adult Health | Admitting: Adult Health

## 2017-09-18 DIAGNOSIS — R928 Other abnormal and inconclusive findings on diagnostic imaging of breast: Secondary | ICD-10-CM

## 2017-09-18 DIAGNOSIS — N6312 Unspecified lump in the right breast, upper inner quadrant: Secondary | ICD-10-CM | POA: Diagnosis not present

## 2017-09-18 DIAGNOSIS — N6314 Unspecified lump in the right breast, lower inner quadrant: Secondary | ICD-10-CM | POA: Diagnosis not present

## 2017-09-18 DIAGNOSIS — R922 Inconclusive mammogram: Secondary | ICD-10-CM | POA: Diagnosis not present

## 2017-10-11 ENCOUNTER — Ambulatory Visit (INDEPENDENT_AMBULATORY_CARE_PROVIDER_SITE_OTHER): Payer: BLUE CROSS/BLUE SHIELD | Admitting: Women's Health

## 2017-10-11 ENCOUNTER — Other Ambulatory Visit (HOSPITAL_COMMUNITY)
Admission: RE | Admit: 2017-10-11 | Discharge: 2017-10-11 | Disposition: A | Discharge status: Home / Self Care | Payer: BLUE CROSS/BLUE SHIELD | Source: Ambulatory Visit | Attending: Obstetrics & Gynecology | Admitting: Obstetrics & Gynecology

## 2017-10-11 ENCOUNTER — Encounter: Payer: Self-pay | Admitting: Women's Health

## 2017-10-11 VITALS — BP 108/67 | HR 60 | Ht 64.2 in | Wt 199.8 lb

## 2017-10-11 DIAGNOSIS — N6001 Solitary cyst of right breast: Secondary | ICD-10-CM

## 2017-10-11 DIAGNOSIS — Z01419 Encounter for gynecological examination (general) (routine) without abnormal findings: Secondary | ICD-10-CM | POA: Insufficient documentation

## 2017-10-11 DIAGNOSIS — G4700 Insomnia, unspecified: Secondary | ICD-10-CM

## 2017-10-11 MED ORDER — ZOLPIDEM TARTRATE 5 MG PO TABS: 5.0000 mg | ORAL_TABLET | Freq: Every evening | ORAL | 0 refills | Status: DC | PRN

## 2017-10-11 NOTE — Progress Notes (Signed)
WELL-WOMAN EXAMINATION Patient name: Abigail Ingram MRN [id number]  Date of birth: Sep 17, 1970 Chief Complaint:   Gynecologic Exam (pap/physcial)  History of Present Illness:   Abigail Ingram is a 47 y.o. G2P2 Caucasian female being seen today for a routine well-woman exam.  Current complaints: trouble sleeping, has been taking melatonin which she feels does help her fall asleep, but always wakes up in middle of night and is unable to go back to sleep. Her mom [facility] in [facility] w/ terminal lung/brain cancer, so has been overwhelmed with that. Has GERD, is not taking Nexium daily as directed b/c she doesn't feel it's a good idea to be on it every day. Takes it when reflux is bad. Has IBS, states carafate helps.   PCP: [facility]      does not desire labs Patient's last menstrual period was 10/02/2017. The current method of family planning is IUD, Liletta placed 07/2014 Last pap 08/26/14. Results were: neg w/ -HRHPV Last mammogram: 09/18/17. Results were: Bi-rads 3: probable benign cyst cluster Rt medial breast, probable benign complicated cysts Rt lateral breast Recommended f/u in 6mths Last colonoscopy: never. Results were: n/a  Review of Systems:   Pertinent items are noted in HPI Denies any headaches, blurred vision, fatigue, shortness of breath, chest pain, abdominal pain, abnormal vaginal discharge/itching/odor/irritation, problems with periods, bowel movements, urination, or intercourse unless otherwise stated above. Pertinent History Reviewed:  Reviewed past medical,surgical, social and family history.  Reviewed problem list, medications and allergies. Physical Assessment:   Vitals:   10/11/17 0920  BP: 108/67  Pulse: 60  Weight: 199 lb 12.8 oz (90.6 kg)  Height: 5' 4.2" (1.631 m)  Body mass index is 34.08 kg/m.        Physical Examination:   General appearance - well appearing, and in no distress  Mental status - alert, oriented to person, place, and time  Psych:  She  has a normal mood and affect  Skin - warm and dry, normal color, no suspicious lesions noted  Chest - effort normal, all lung fields clear to auscultation bilaterally  Heart - normal rate and regular rhythm  Neck:  midline trachea, no thyromegaly or nodules  Breasts - LT: breast appears normal, no suspicious masses, no skin or nipple changes or axillary nodes; RT: non-tender abnormalities felt corresponding w/ recent mammo/ultrasound at around 9-10 o'clock ~2-1033fb from nipple, as well as 3 o'clock about 3-54fb from nipple   Abdomen - soft, nontender, nondistended, no masses or organomegaly  Pelvic - VULVA: normal appearing vulva with no masses, tenderness or lesions  VAGINA: normal appearing vagina with normal color and discharge, no lesions  CERVIX: normal appearing cervix without discharge or lesions, no CMT  Thin prep pap is done w/ reflex HR HPV cotesting  UTERUS: uterus is felt to be normal size, shape, consistency and nontender   ADNEXA: No adnexal masses or tenderness noted.  Rectal - multiple external non-thrombosed hemorrhoids, one pedunculated- pt states doesn't bother her  Extremities:  No swelling or varicosities noted  No results found for this or any previous visit (from the past 24 hour(s)).  Assessment & Plan:  1) Well-Woman Exam  2) Insomnia> will try ambien 5mg  qhs prn sleep #30 w/ 0RF, let me know if it helps  3) Multiple cysts Rt breast> keep f/u mammogram/ultrasound in Dec, let us know if anything changes before  Labs/procedures today: pap, declines any other labs  Mammogram in Dec as planned, or sooner if problems Colonoscopy @47yo   or sooner if problems  No orders of the defined types were placed in this encounter.   Follow-up: Return in about 1 year (around 10/12/2018) for Physical.  Cheral Marker CNM, WHNP-BC 10/11/2017 11:27 AM

## 2017-10-11 NOTE — Patient Instructions (Addendum)
Keep follow-up for mammogram in December

## 2017-10-12 LAB — CYTOLOGY - PAP
Diagnosis: NEGATIVE
HPV (WINDOPATH): NOT DETECTED

## 2017-10-25 ENCOUNTER — Encounter: Payer: Self-pay | Admitting: Gastroenterology

## 2017-12-19 ENCOUNTER — Other Ambulatory Visit: Payer: Self-pay | Admitting: Gastroenterology

## 2017-12-19 DIAGNOSIS — R197 Diarrhea, unspecified: Secondary | ICD-10-CM

## 2017-12-19 DIAGNOSIS — R109 Unspecified abdominal pain: Secondary | ICD-10-CM

## 2017-12-19 DIAGNOSIS — K219 Gastro-esophageal reflux disease without esophagitis: Secondary | ICD-10-CM

## 2018-01-24 ENCOUNTER — Ambulatory Visit: Payer: BLUE CROSS/BLUE SHIELD | Admitting: Gastroenterology

## 2018-01-28 ENCOUNTER — Ambulatory Visit: Payer: BLUE CROSS/BLUE SHIELD | Admitting: Nurse Practitioner

## 2018-02-11 ENCOUNTER — Ambulatory Visit (INDEPENDENT_AMBULATORY_CARE_PROVIDER_SITE_OTHER): Payer: BLUE CROSS/BLUE SHIELD | Admitting: Nurse Practitioner

## 2018-02-11 ENCOUNTER — Encounter: Payer: Self-pay | Admitting: Nurse Practitioner

## 2018-02-11 VITALS — BP 118/76 | HR 56 | Temp 97.6°F | Ht 65.0 in | Wt 201.2 lb

## 2018-02-11 DIAGNOSIS — R109 Unspecified abdominal pain: Secondary | ICD-10-CM | POA: Diagnosis not present

## 2018-02-11 DIAGNOSIS — R197 Diarrhea, unspecified: Secondary | ICD-10-CM | POA: Diagnosis not present

## 2018-02-11 DIAGNOSIS — K219 Gastro-esophageal reflux disease without esophagitis: Secondary | ICD-10-CM | POA: Diagnosis not present

## 2018-02-11 NOTE — Patient Instructions (Signed)
1. Continue taking your current medications. 2. Call our office if you have any problems. 3. Return for follow-up in 6 months. 4. Call us if you have any questions or concerns.  At [facility] we value your feedback. You may receive a survey about your visit today. Please share your experience as we strive to create trusting relationships with our patients to provide genuine, compassionate, quality care.  We appreciate your understanding and patience as we review any laboratory studies, imaging, and other diagnostic tests that are ordered as we care for you. Our office policy is 5 business days for review of these results, and any emergent or urgent results are addressed in a timely manner for your best interest. If you do not hear from our office in 1 week, please contact us.   We also encourage the use of MyChart, which contains your medical information for your review as well. If you are not enrolled in this feature, an access code is on this after visit summary for your convenience. Thank you for allowing [place] to be involved in your care.  It was great to see you today and I hope you have a great Fall!! Again, I'm sorry to hear about your Mom's passing.

## 2018-02-11 NOTE — Progress Notes (Signed)
Referring Provider: Elfredia Nevins, MD Primary Care Physician:  Elfredia Nevins, MD Primary GI:  Dr. Darrick Penna  Chief Complaint  Patient presents with  . Gastroesophageal Reflux    f/u.     HPI:   Abigail Ingram is a 47 y.o. female who presents for follow-up on GERD.  Patient was last seen in our office 06/25/2017 for GERD, abdominal pain, diarrhea.  History of IBS and reflux.  Historically had an EGD in [place] with esophagitis and was started on Nexium.  Imodium helps diarrhea.  At her last visit she noted a couple spells of IBS, GERD is intermittent and she typically alternates between Nexium and Carafate.  Typically takes Nexium once daily and Carafate about 1-2 times daily.  Occasional nausea for which she takes gingerroot.  Nighttime reflux she treats with peppermint oil.  Has made dietary changes.  Previous he tried and failed omeprazole.  Previously tried [employer] cannot remember effectiveness.  She still drinks coffee once a day even though she knows it can cause some worsening symptoms.  Recommend continue Carafate, try Dexilant for Nexium, progress report in 1 to 2 weeks, follow-up in 2 months.  It appears a progress report was not called our office.  Reviewed EGD which was completed 08/04/2013 and found nonsevere esophagitis at the GE junction, normal stomach, normal duodenum, GERD with erosive esophagitis.  Recommended continue current medications.  Today she states she's doing ok overall. She tried Dexilant symptoms but caused diarrhea so she stopped taking it. She hasn't been taking Nexium much at all; takes Carafate once a day, at time twice a day. This tends to control her symptoms well. Has also made dietary changes and thinks this has helped as well. Her IBS symptoms recently flared up when her mom passed away about 5 months ago. She has tried CBD oil which helped ehr stress and GI upset/IBS. She stopped taking it when school was starting back due to personal concerns  about a positive drug test (even tho CBD has <0.03% THC per regulatory requirements.)  Past Medical History:  Diagnosis Date  . Breast nodule    Right   . GERD (gastroesophageal reflux disease)   . Gestational diabetes   . IBS (irritable bowel syndrome)     Past Surgical History:  Procedure Laterality Date  . CHOLECYSTECTOMY    . ESOPHAGOGASTRODUODENOSCOPY  2015  . kidney valve repair       Current Outpatient Medications  Medication Sig Dispense Refill  . Ginger, Zingiber officinalis, (GINGER ROOT PO) Take by mouth as needed.    Marland Kitchen OVER THE COUNTER MEDICATION Calcium by mouth once a day (unsure of dose)    . sucralfate (CARAFATE) 1 g tablet TAKE 1 TABLET(1 GRAM) BY MOUTH FOUR TIMES DAILY AT BEDTIME AND WITH MEALS (Patient taking differently: TAKE 1 TABLET(1 GRAM) BY MOUTH ONCE A DAY) 60 tablet 0  . zolpidem (AMBIEN) 5 MG tablet Take 1 tablet (5 mg total) by mouth at bedtime as needed for sleep. 30 tablet 0  . esomeprazole (NEXIUM) 20 MG capsule Take 20 mg by mouth as needed.     No current facility-administered medications for this visit.     Allergies as of 02/11/2018  . (No Known Allergies)    Family History  Problem Relation Age of Onset  . Cancer Other        breast  . Deep vein thrombosis Mother        + for inherited clotting disorder, on coumadin  .  Cancer Father        skin  . Cancer Sister        skin  . Colon cancer Maternal Grandmother        colon   . Cancer Paternal Grandmother        skin  . Diabetes Other   . Hypertension Other   . Stroke Other   . Mental illness Other     Social History   Socioeconomic History  . Marital status: Married    Spouse name: Not on file  . Number of children: Not on file  . Years of education: Not on file  . Highest education level: Not on file  Occupational History  . Not on file  Social Needs  . Financial resource strain: Not on file  . Food insecurity:    Worry: Not on file    Inability: Not on file  .  Transportation needs:    Medical: Not on file    Non-medical: Not on file  Tobacco Use  . Smoking status: Never Smoker  . Smokeless tobacco: Never Used  Substance and Sexual Activity  . Alcohol use: Yes    Comment: ocassional  . Drug use: No  . Sexual activity: Yes    Birth control/protection: IUD  Lifestyle  . Physical activity:    Days per week: Not on file    Minutes per session: Not on file  . Stress: Not on file  Relationships  . Social connections:    Talks on phone: Not on file    Gets together: Not on file    Attends religious service: Not on file    Active member of club or organization: Not on file    Attends meetings of clubs or organizations: Not on file    Relationship status: Not on file  Other Topics Concern  . Not on file  Social History Narrative  . Not on file    Review of Systems: Complete ROS negative except as per HPI.   Physical Exam: BP 118/76   Pulse (!) 56   Temp 97.6 F (36.4 C) (Oral)   Ht 5\' 5"  (1.651 m)   Wt 201 lb 3.2 oz (91.3 kg)   BMI 33.48 kg/m  General:   Alert and oriented. Pleasant and cooperative. Well-nourished and well-developed.  Eyes:  Without icterus, sclera clear and conjunctiva pink.  Ears:  Normal auditory acuity. Cardiovascular:  S1, S2 present without murmurs appreciated. Extremities without clubbing or edema. Respiratory:  Clear to auscultation bilaterally. No wheezes, rales, or rhonchi. No distress.  Gastrointestinal:  +BS, soft, non-tender and non-distended. No HSM noted. No guarding or rebound. No masses appreciated.  Rectal:  Deferred  Musculoskalatal:  Symmetrical without gross deformities. Neurologic:  Alert and oriented x4;  grossly normal neurologically. Psych:  Alert and cooperative. Normal mood and affect. Heme/Lymph/Immune: No excessive bruising noted.    02/11/2018 12:28 PM   Disclaimer: This note was dictated with voice recognition software. Similar sounding words can inadvertently be transcribed  and may not be corrected upon review.

## 2018-02-11 NOTE — Assessment & Plan Note (Signed)
Diarrhea improved on Bentyl as needed.  Likely a component of IBS and increased stress.  Recommend she continue her current medications as needed, notify us of any refill request.

## 2018-02-11 NOTE — Assessment & Plan Note (Signed)
Likely related to some component of IBS.  Symptoms are doing much better on Bentyl which she does not need often.  Recommend she continue her current medications and follow-up in 6 months.

## 2018-02-11 NOTE — Assessment & Plan Note (Signed)
GERD symptoms doing well overall.  She is cut back on Nexium and does not take it often.  She currently uses Carafate about once a day, sometimes will take a second dose later in the day.  This controls her symptoms pretty well.  Recommend she continue her current regimen which is effective for her.  Follow-up in 6 months.  Call if any worsening symptoms.

## 2018-02-12 NOTE — Progress Notes (Signed)
CC'D TO PCP °

## 2018-02-15 ENCOUNTER — Other Ambulatory Visit: Payer: Self-pay | Admitting: Gastroenterology

## 2018-02-15 DIAGNOSIS — R197 Diarrhea, unspecified: Secondary | ICD-10-CM

## 2018-02-15 DIAGNOSIS — R109 Unspecified abdominal pain: Secondary | ICD-10-CM

## 2018-02-15 DIAGNOSIS — K219 Gastro-esophageal reflux disease without esophagitis: Secondary | ICD-10-CM

## 2018-04-04 ENCOUNTER — Other Ambulatory Visit: Payer: Self-pay | Admitting: Gastroenterology

## 2018-04-04 DIAGNOSIS — R109 Unspecified abdominal pain: Secondary | ICD-10-CM

## 2018-04-04 DIAGNOSIS — K219 Gastro-esophageal reflux disease without esophagitis: Secondary | ICD-10-CM

## 2018-04-04 DIAGNOSIS — R197 Diarrhea, unspecified: Secondary | ICD-10-CM

## 2018-06-01 ENCOUNTER — Other Ambulatory Visit: Payer: Self-pay | Admitting: Gastroenterology

## 2018-06-01 DIAGNOSIS — R197 Diarrhea, unspecified: Secondary | ICD-10-CM

## 2018-06-01 DIAGNOSIS — R109 Unspecified abdominal pain: Secondary | ICD-10-CM

## 2018-06-01 DIAGNOSIS — K219 Gastro-esophageal reflux disease without esophagitis: Secondary | ICD-10-CM

## 2018-06-25 ENCOUNTER — Encounter: Payer: Self-pay | Admitting: Gastroenterology

## 2018-07-24 ENCOUNTER — Encounter: Payer: Self-pay | Admitting: Gastroenterology

## 2019-01-01 ENCOUNTER — Telehealth: Payer: Self-pay | Admitting: Gastroenterology

## 2019-01-01 DIAGNOSIS — K219 Gastro-esophageal reflux disease without esophagitis: Secondary | ICD-10-CM

## 2019-01-01 DIAGNOSIS — R197 Diarrhea, unspecified: Secondary | ICD-10-CM

## 2019-01-01 DIAGNOSIS — R109 Unspecified abdominal pain: Secondary | ICD-10-CM

## 2019-01-01 MED ORDER — SUCRALFATE 1 G PO TABS: ORAL_TABLET | ORAL | 1 refills | Status: DC

## 2019-01-01 NOTE — Telephone Encounter (Signed)
Patient due for f/u ov. Limited refills provided.

## 2019-01-01 NOTE — Addendum Note (Signed)
Addended by: Mahala Menghini on: 01/01/2019 01:48 PM   Modules accepted: Orders

## 2019-01-01 NOTE — Telephone Encounter (Signed)
Forwarding to refill box.  

## 2019-01-01 NOTE — Telephone Encounter (Signed)
Pt needs a refill on Carafate. She uses [facility] on [address]. She took her last one today.

## 2019-01-02 ENCOUNTER — Encounter: Payer: Self-pay | Admitting: Gastroenterology

## 2019-01-02 NOTE — Telephone Encounter (Signed)
LM for a return call.  

## 2019-01-02 NOTE — Telephone Encounter (Signed)
Letter mailed to pt to call for appointment.

## 2019-01-02 NOTE — Telephone Encounter (Signed)
SCHEDULED PATIENT.

## 2019-01-24 ENCOUNTER — Ambulatory Visit: Payer: BLUE CROSS/BLUE SHIELD | Admitting: Gastroenterology

## 2019-02-12 ENCOUNTER — Ambulatory Visit: Payer: Self-pay | Admitting: Gastroenterology

## 2019-02-27 ENCOUNTER — Other Ambulatory Visit: Payer: Self-pay | Admitting: Gastroenterology

## 2019-02-27 DIAGNOSIS — K219 Gastro-esophageal reflux disease without esophagitis: Secondary | ICD-10-CM

## 2019-02-27 DIAGNOSIS — R197 Diarrhea, unspecified: Secondary | ICD-10-CM

## 2019-02-27 DIAGNOSIS — R109 Unspecified abdominal pain: Secondary | ICD-10-CM

## 2019-03-05 ENCOUNTER — Encounter: Payer: Self-pay | Admitting: Gastroenterology

## 2019-03-05 ENCOUNTER — Other Ambulatory Visit: Payer: Self-pay

## 2019-03-05 ENCOUNTER — Ambulatory Visit (INDEPENDENT_AMBULATORY_CARE_PROVIDER_SITE_OTHER): Payer: BC Managed Care – PPO | Admitting: Gastroenterology

## 2019-03-05 DIAGNOSIS — R197 Diarrhea, unspecified: Secondary | ICD-10-CM

## 2019-03-05 DIAGNOSIS — K2100 Gastro-esophageal reflux disease with esophagitis, without bleeding: Secondary | ICD-10-CM

## 2019-03-05 DIAGNOSIS — K580 Irritable bowel syndrome with diarrhea: Secondary | ICD-10-CM

## 2019-03-05 DIAGNOSIS — K219 Gastro-esophageal reflux disease without esophagitis: Secondary | ICD-10-CM | POA: Diagnosis not present

## 2019-03-05 DIAGNOSIS — R109 Unspecified abdominal pain: Secondary | ICD-10-CM | POA: Diagnosis not present

## 2019-03-05 DIAGNOSIS — K589 Irritable bowel syndrome without diarrhea: Secondary | ICD-10-CM | POA: Insufficient documentation

## 2019-03-05 MED ORDER — SUCRALFATE 1 G PO TABS: ORAL_TABLET | ORAL | 3 refills | Status: DC

## 2019-03-05 NOTE — Progress Notes (Signed)
Primary Care Physician:  Abigail Ingram, [place], MD Primary GI:  Abigail EvaSandi Fields, MD   Patient Location: Home  [facility]  Reason for Phone Visit: GERD/IBS  Persons present on the phone encounter, with roles: Patient, myself Abigail Ingram(provider),Abigail Booth, LPN (updated meds and allergies)  Total time (minutes) spent on medical discussion: 8 minutes  Due to COVID-19, visit was conducted using telephonic method (no video was available). Patient could not get microphone/camera to allow doxy visit. Visit was requested by patient.  Virtual Visit via Telephone only  I connected with Abigail Ingram on 03/05/19 at  3:30 PM EST by telephone and verified that I am speaking with the correct person using two identifiers.   I discussed the limitations, risks, security and privacy concerns of performing an evaluation and management service by telephone and the availability of in person appointments. I also discussed with the patient that there may be a patient responsible charge related to this service. The patient expressed understanding and agreed to proceed.  Chief Complaint  Patient presents with  . Gastroesophageal Reflux    HPI:   Patient is a pleasant 48 yo female who presents for telephone visit regarding IBS/GERD. Last seen 02/2018.  [facility] in 2015. Had esophagitis at GE junction. Was on omeprazole BID at the time. Was switched to Nexium. Did well for a period of time. Tried Dexilant but had to discontinue due to diarrhea.  Historically has managed her GERD symptoms with Carafate and this has also helped her IBS-D. She noticed this when she took her father's Carafate once when she was having refractory symptoms.   Over the past year she has done fairly well. Doing better than in the past. Takes  Carafate once to twice per day. Being careful with meals, no fried or spicy. No process or preserved foods. BMs stable. No melena, brbpr.    Current Outpatient Medications  Medication Sig Dispense Refill  . Ginger, Zingiber officinalis, (GINGER ROOT PO) Take by mouth as needed.    . Multiple Vitamin (MULTIVITAMIN) tablet Take 1 tablet by mouth daily.    Marland Kitchen. OVER THE COUNTER MEDICATION Calcium by mouth once a day (unsure of dose)    . sucralfate (CARAFATE) 1 g tablet TAKE 1 TABLET(1 GRAM) BY MOUTH FOUR TIMES DAILY(WITH MEALS AND AT BEDTIME) AS NEEDED 120 tablet 1   No current facility-administered medications for this visit.     Past Medical History:  Diagnosis Date  . Breast nodule    Right   . GERD (gastroesophageal reflux disease)   . Gestational diabetes   . IBS (irritable bowel syndrome)     Past Surgical History:  Procedure Laterality Date  . CHOLECYSTECTOMY    . ESOPHAGOGASTRODUODENOSCOPY  2015  . kidney valve repair       Family History  Problem Relation Age of Onset  . Cancer Other        breast  . Deep vein thrombosis Mother        + for inherited clotting disorder, on coumadin  . Cancer Father        skin  . Cancer Sister        skin  . Colon cancer Maternal Grandmother        over age 49, colon   . Cancer Paternal Grandmother        skin  . Diabetes Other   . Hypertension Other   . Stroke Other   .  Mental illness Other     Social History   Socioeconomic History  . Marital status: Married    Spouse name: Not on file  . Number of children: Not on file  . Years of education: Not on file  . Highest education level: Not on file  Occupational History  . Not on file  Social Needs  . Financial resource strain: Not on file  . Food insecurity    Worry: Not on file    Inability: Not on file  . Transportation needs    Medical: Not on file    Non-medical: Not on file  Tobacco Use  . Smoking status: Never Smoker  . Smokeless tobacco: Never Used  Substance and Sexual Activity  . Alcohol use: Yes    Comment: ocassional  . Drug use: No  . Sexual activity: Yes    Birth control/protection:  I.U.D.  Lifestyle  . Physical activity    Days per week: Not on file    Minutes per session: Not on file  . Stress: Not on file  Relationships  . Social Herbalist on phone: Not on file    Gets together: Not on file    Attends religious service: Not on file    Active member of club or organization: Not on file    Attends meetings of clubs or organizations: Not on file    Relationship status: Not on file  . Intimate partner violence    Fear of current or ex partner: Not on file    Emotionally abused: Not on file    Physically abused: Not on file    Forced sexual activity: Not on file  Other Topics Concern  . Not on file  Social History Narrative  . Not on file      ROS:  General: Negative for anorexia, weight loss, fever, chills, fatigue, weakness. Eyes: Negative for vision changes.  ENT: Negative for hoarseness, difficulty swallowing , nasal congestion. CV: Negative for chest pain, angina, palpitations, dyspnea on exertion, peripheral edema.  Respiratory: Negative for dyspnea at rest, dyspnea on exertion, cough, sputum, wheezing.  GI: See history of present illness. GU:  Negative for dysuria, hematuria, urinary incontinence, urinary frequency, nocturnal urination.  MS: Negative for joint pain, low back pain.  Derm: Negative for rash or itching.  Neuro: Negative for weakness, abnormal sensation, seizure, frequent headaches, memory loss, confusion.  Psych: Negative for anxiety, depression, suicidal ideation, hallucinations.  Endo: Negative for unusual weight change.  Heme: Negative for bruising or bleeding. Allergy: Negative for rash or hives.   Observations/Objective: Pleasant female, in no acute distress. Cooperative. No apparent shortness of breath. Otherwise exam unavailable.   Assessment and Plan: 48 y/o female with history of GERD and IBS, historically managed with limited Carafate 1-2 per day. Previously failed omeprazole, Nexium stopped working, Building surveyor  caused diarrhea. She is satisfied with current regimen. Will continue Carafate 1-2 times per day. Call with recurrent symptoms. Would advise colonoscopy at age 45. We will have her return 11/2019 for follow up and schedule colonoscopy. Call in interim with any problems.  Follow Up Instructions:    I discussed the assessment and treatment plan with the patient. The patient was provided an opportunity to ask questions and all were answered. The patient agreed with the plan and demonstrated an understanding of the instructions. AVS mailed to patient's home address.   The patient was advised to call back or seek an in-person evaluation if the symptoms worsen or if  the condition fails to improve as anticipated.  I provided 8 minutes of non-face-to-face time during this encounter.   Tana Coast, PA-C

## 2019-03-05 NOTE — Patient Instructions (Signed)
1. Continue carafate one to two times daily as needed for management of reflux symptoms.  2. We will plan to see you back in 11/2019 for follow up and arrange for your first colonoscopy at that time.    Gastroesophageal Reflux Disease, Adult Gastroesophageal reflux (GER) happens when acid from the stomach flows up into the tube that connects the mouth and the stomach (esophagus). Normally, food travels down the esophagus and stays in the stomach to be digested. However, when a person has GER, food and stomach acid sometimes move back up into the esophagus. If this becomes a more serious problem, the person may be diagnosed with a disease called gastroesophageal reflux disease (GERD). GERD occurs when the reflux:  Happens often.  Causes frequent or severe symptoms.  Causes problems such as damage to the esophagus. When stomach acid comes in contact with the esophagus, the acid may cause soreness (inflammation) in the esophagus. Over time, GERD may create small holes (ulcers) in the lining of the esophagus. What are the causes? This condition is caused by a problem with the muscle between the esophagus and the stomach (lower esophageal sphincter, or LES). Normally, the LES muscle closes after food passes through the esophagus to the stomach. When the LES is weakened or abnormal, it does not close properly, and that allows food and stomach acid to go back up into the esophagus. The LES can be weakened by certain dietary substances, medicines, and medical conditions, including:  Tobacco use.  Pregnancy.  Having a hiatal hernia.  Alcohol use.  Certain foods and beverages, such as coffee, chocolate, onions, and peppermint. What increases the risk? You are more likely to develop this condition if you:  Have an increased body weight.  Have a connective tissue disorder.  Use NSAID medicines. What are the signs or symptoms? Symptoms of this condition include:  Heartburn.  Difficult or  painful swallowing.  The feeling of having a lump in the throat.  Abitter taste in the mouth.  Bad breath.  Having a large amount of saliva.  Having an upset or bloated stomach.  Belching.  Chest pain. Different conditions can cause chest pain. Make sure you see your health care provider if you experience chest pain.  Shortness of breath or wheezing.  Ongoing (chronic) cough or a night-time cough.  Wearing away of tooth enamel.  Weight loss. How is this diagnosed? Your health care provider will take a medical history and perform a physical exam. To determine if you have mild or severe GERD, your health care provider may also monitor how you respond to treatment. You may also have tests, including:  A test to examine your stomach and esophagus with a small camera (endoscopy).  A test thatmeasures the acidity level in your esophagus.  A test thatmeasures how much pressure is on your esophagus.  A barium swallow or modified barium swallow test to show the shape, size, and functioning of your esophagus. How is this treated? The goal of treatment is to help relieve your symptoms and to prevent complications. Treatment for this condition may vary depending on how severe your symptoms are. Your health care provider may recommend:  Changes to your diet.  Medicine.  Surgery. Follow these instructions at home: Eating and drinking   Follow a diet as recommended by your health care provider. This may involve avoiding foods and drinks such as: ? Coffee and tea (with or without caffeine). ? Drinks that containalcohol. ? Energy drinks and sports drinks. ? Carbonated  drinks or sodas. ? Chocolate and cocoa. ? Peppermint and mint flavorings. ? Garlic and onions. ? Horseradish. ? Spicy and acidic foods, including peppers, chili powder, curry powder, vinegar, hot sauces, and barbecue sauce. ? Citrus fruit juices and citrus fruits, such as oranges, lemons, and limes. ?  Tomato-based foods, such as red sauce, chili, salsa, and pizza with red sauce. ? Fried and fatty foods, such as donuts, french fries, potato chips, and high-fat dressings. ? High-fat meats, such as hot dogs and fatty cuts of red and white meats, such as rib eye steak, sausage, ham, and bacon. ? High-fat dairy items, such as whole milk, butter, and cream cheese.  Eat small, frequent meals instead of large meals.  Avoid drinking large amounts of liquid with your meals.  Avoid eating meals during the 2-3 hours before bedtime.  Avoid lying down right after you eat.  Do not exercise right after you eat. Lifestyle   Do not use any products that contain nicotine or tobacco, such as cigarettes, e-cigarettes, and chewing tobacco. If you need help quitting, ask your health care provider.  Try to reduce your stress by using methods such as yoga or meditation. If you need help reducing stress, ask your health care provider.  If you are overweight, reduce your weight to an amount that is healthy for you. Ask your health care provider for guidance about a safe weight loss goal. General instructions  Pay attention to any changes in your symptoms.  Take over-the-counter and prescription medicines only as told by your health care provider. Do not take aspirin, ibuprofen, or other NSAIDs unless your health care provider told you to do so.  Wear loose-fitting clothing. Do not wear anything tight around your waist that causes pressure on your abdomen.  Raise (elevate) the head of your bed about 6 inches (15 cm).  Avoid bending over if this makes your symptoms worse.  Keep all follow-up visits as told by your health care provider. This is important. Contact a health care provider if:  You have: ? New symptoms. ? Unexplained weight loss. ? Difficulty swallowing or it hurts to swallow. ? Wheezing or a persistent cough. ? A hoarse voice.  Your symptoms do not improve with treatment. Get help  right away if you:  Have pain in your arms, neck, jaw, teeth, or back.  Feel sweaty, dizzy, or light-headed.  Have chest pain or shortness of breath.  Vomit and your vomit looks like blood or coffee grounds.  Faint.  Have stool that is bloody or black.  Cannot swallow, drink, or eat. Summary  Gastroesophageal reflux happens when acid from the stomach flows up into the esophagus. GERD is a disease in which the reflux happens often, causes frequent or severe symptoms, or causes problems such as damage to the esophagus.  Treatment for this condition may vary depending on how severe your symptoms are. Your health care provider may recommend diet and lifestyle changes, medicine, or surgery.  Contact a health care provider if you have new or worsening symptoms.  Take over-the-counter and prescription medicines only as told by your health care provider. Do not take aspirin, ibuprofen, or other NSAIDs unless your health care provider told you to do so.  Keep all follow-up visits as told by your health care provider. This is important. This information is not intended to replace advice given to you by your health care provider. Make sure you discuss any questions you have with your health care provider. Document Released:  12/28/2004 Document Revised: 09/26/2017 Document Reviewed: 09/26/2017 Elsevier Patient Education  2020 [employer] for Gastroesophageal Reflux Disease, Adult When you have gastroesophageal reflux disease (GERD), the foods you eat and your eating habits are very important. Choosing the right foods can help ease the discomfort of GERD. Consider working with a diet and nutrition specialist (dietitian) to help you make healthy food choices. What general guidelines should I follow?  Eating plan  Choose healthy foods low in fat, such as fruits, vegetables, whole grains, low-fat dairy products, and lean meat, fish, and poultry.  Eat frequent, small meals  instead of three large meals each day. Eat your meals slowly, in a relaxed setting. Avoid bending over or lying down until 2-3 hours after eating.  Limit high-fat foods such as fatty meats or fried foods.  Limit your intake of oils, butter, and shortening to less than 8 teaspoons each day.  Avoid the following: ? Foods that cause symptoms. These may be different for different people. Keep a food diary to keep track of foods that cause symptoms. ? Alcohol. ? Drinking large amounts of liquid with meals. ? Eating meals during the 2-3 hours before bed.  Cook foods using methods other than frying. This may include baking, grilling, or broiling. Lifestyle  Maintain a healthy weight. Ask your health care provider what weight is healthy for you. If you need to lose weight, work with your health care provider to do so safely.  Exercise for at least 30 minutes on 5 or more days each week, or as told by your health care provider.  Avoid wearing clothes that fit tightly around your waist and chest.  Do not use any products that contain nicotine or tobacco, such as cigarettes and e-cigarettes. If you need help quitting, ask your health care provider.  Sleep with the head of your bed raised. Use a wedge under the mattress or blocks under the bed frame to raise the head of the bed. What foods are not recommended? The items listed may not be a complete list. Talk with your dietitian about what dietary choices are best for you. Grains Pastries or quick breads with added fat. [place] toast. Vegetables Deep fried vegetables. [place] fries. Any vegetables prepared with added fat. Any vegetables that cause symptoms. For some people this may include tomatoes and tomato products, chili peppers, onions and garlic, and horseradish. Fruits Any fruits prepared with added fat. Any fruits that cause symptoms. For some people this may include citrus fruits, such as oranges, grapefruit, pineapple, and lemons. Meats  and other protein foods High-fat meats, such as fatty beef or pork, hot dogs, ribs, ham, sausage, salami and bacon. Fried meat or protein, including fried fish and fried chicken. Nuts and nut butters. Dairy Whole milk and chocolate milk. Sour cream. Cream. Ice cream. Cream cheese. Milk shakes. Beverages Coffee and tea, with or without caffeine. Carbonated beverages. Sodas. Energy drinks. Fruit juice made with acidic fruits (such as orange or grapefruit). Tomato juice. Alcoholic drinks. Fats and oils Butter. Margarine. Shortening. Ghee. Sweets and desserts Chocolate and cocoa. Donuts. Seasoning and other foods Pepper. Peppermint and spearmint. Any condiments, herbs, or seasonings that cause symptoms. For some people, this may include curry, hot sauce, or vinegar-based salad dressings. Summary  When you have gastroesophageal reflux disease (GERD), food and lifestyle choices are very important to help ease the discomfort of GERD.  Eat frequent, small meals instead of three large meals each day. Eat your meals slowly, in a  relaxed setting. Avoid bending over or lying down until 2-3 hours after eating.  Limit high-fat foods such as fatty meat or fried foods. This information is not intended to replace advice given to you by your health care provider. Make sure you discuss any questions you have with your health care provider. Document Released: 03/20/2005 Document Revised: 07/11/2018 Document Reviewed: 03/21/2016 Elsevier Patient Education  2020 [employer].

## 2019-03-06 ENCOUNTER — Encounter: Payer: Self-pay | Admitting: Gastroenterology

## 2019-05-20 IMAGING — US US BREAST*R* LIMITED INC AXILLA
1 series · 13 of 19 positions shown · non-contrast
Comparison: Previous exam(s).

CLINICAL DATA: 46-year-old female presenting for annual bilateral
mammogram with delayed follow-up for recalled right breast asymmetry
palpable abnormality which she states has been there for a long
time.

EXAM:
DIGITAL DIAGNOSTIC BILATERAL MAMMOGRAM WITH CAD AND TOMO
ULTRASOUND RIGHT BREAST

[Series 1: us breast*right* limited inc axilla · 0.06mm/px · 13 of 19 slices shown]
[im 1/19]
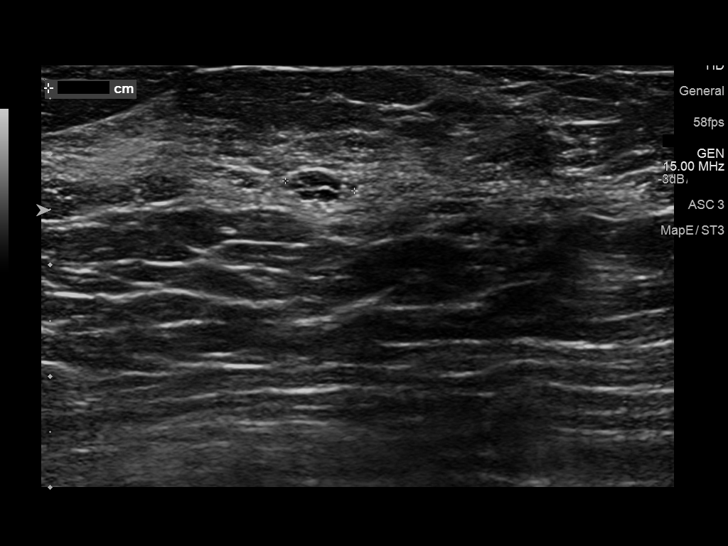
[im 3/19]
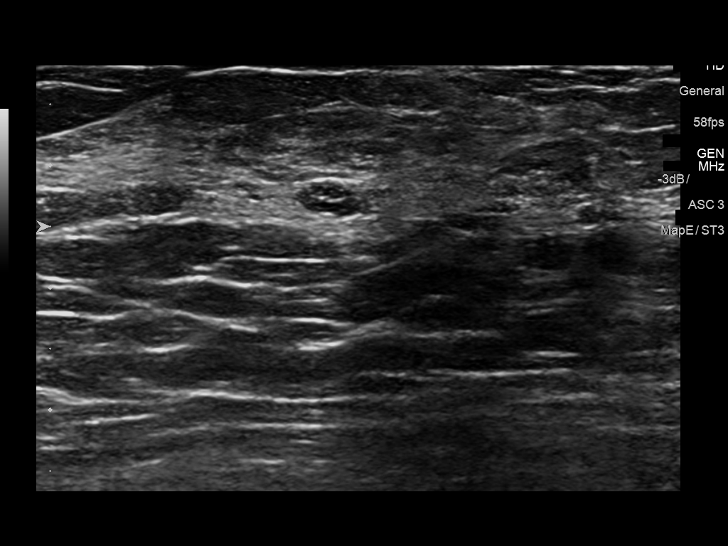
[im 4/19]
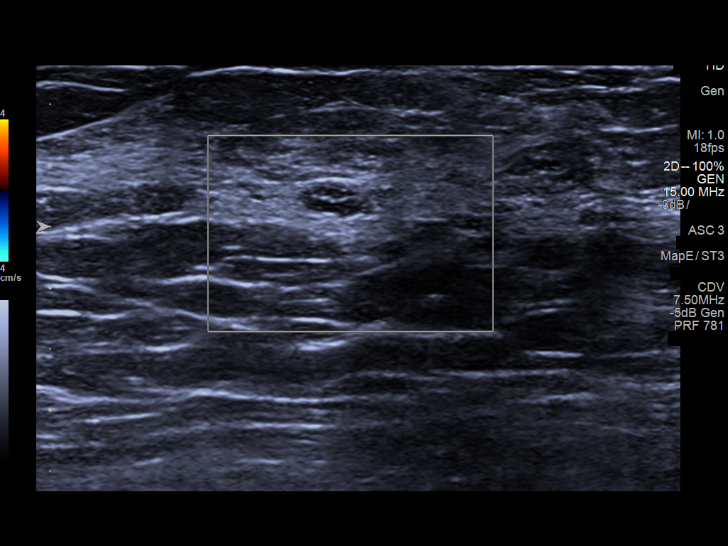
[im 6/19]
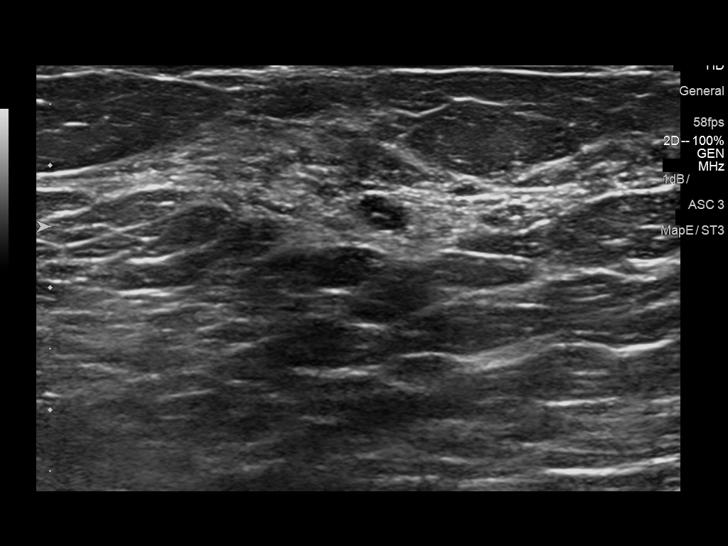
[im 7/19]
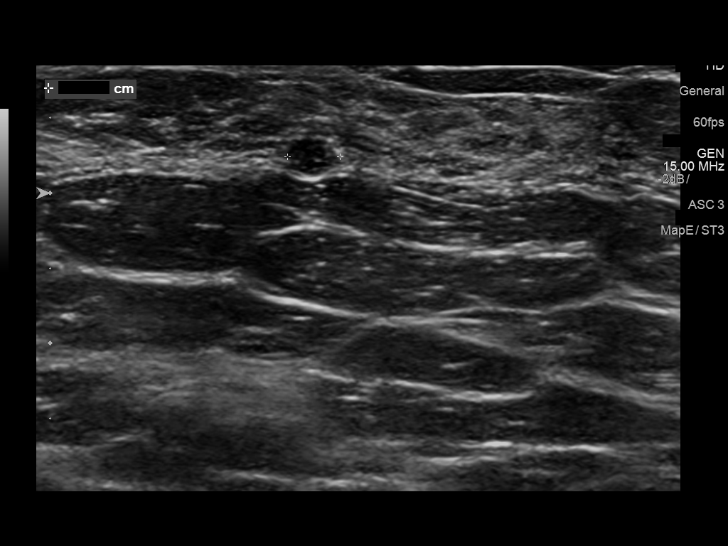
[im 9/19]
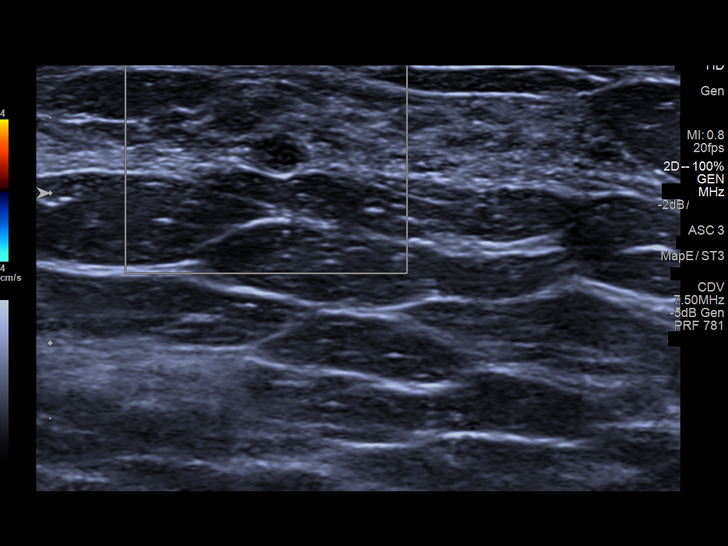
[im 10/19]
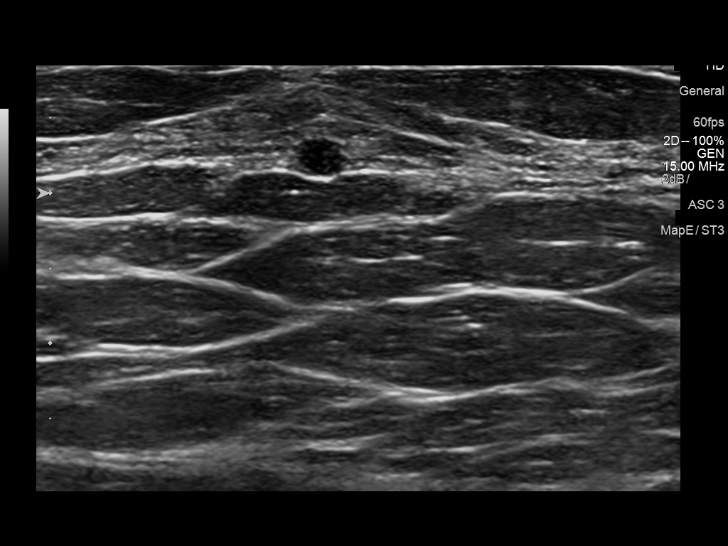
[im 11/19]
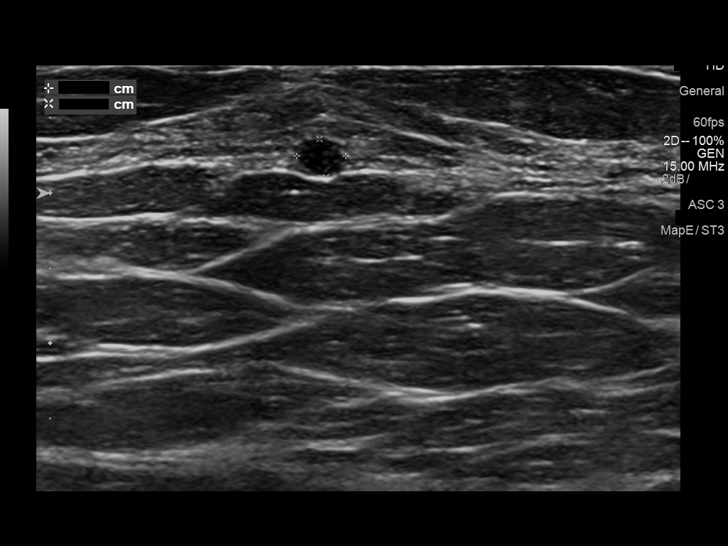
[im 13/19]
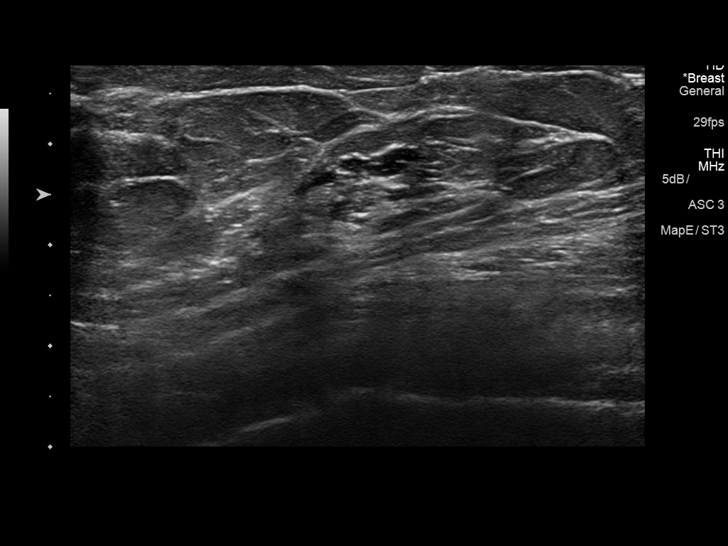
[im 14/19]
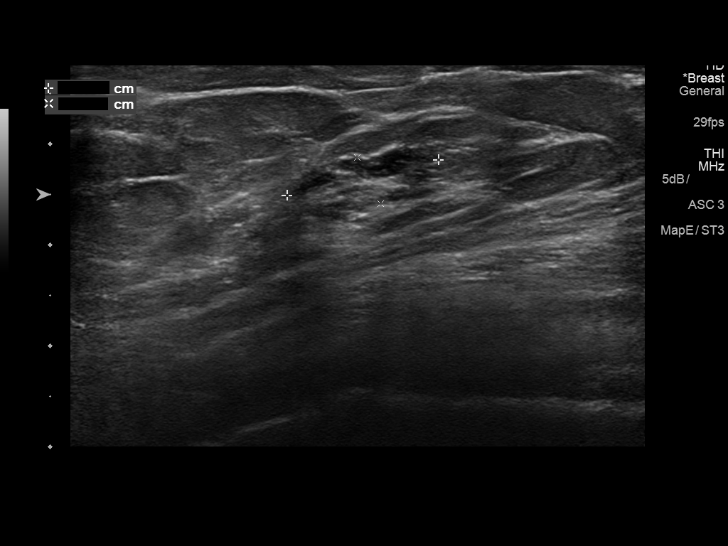
[im 16/19]
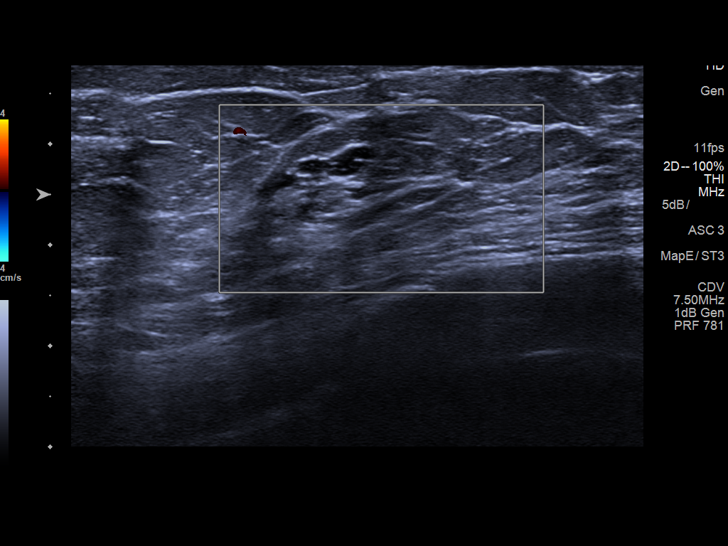
[im 17/19]
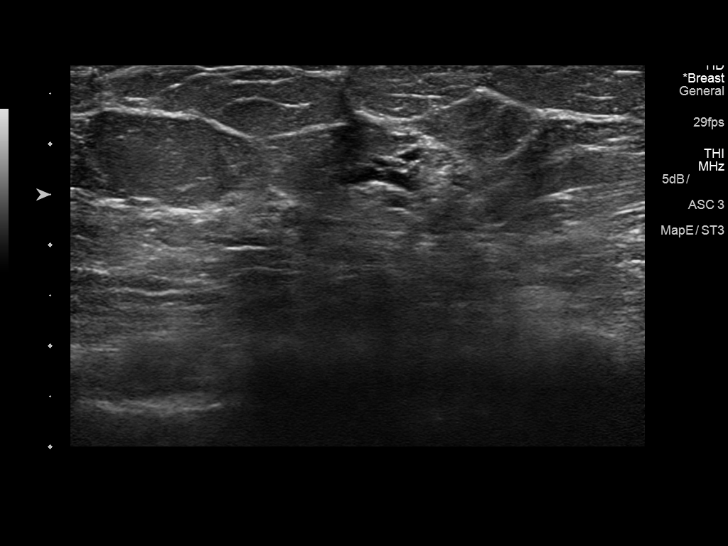
[im 19/19]
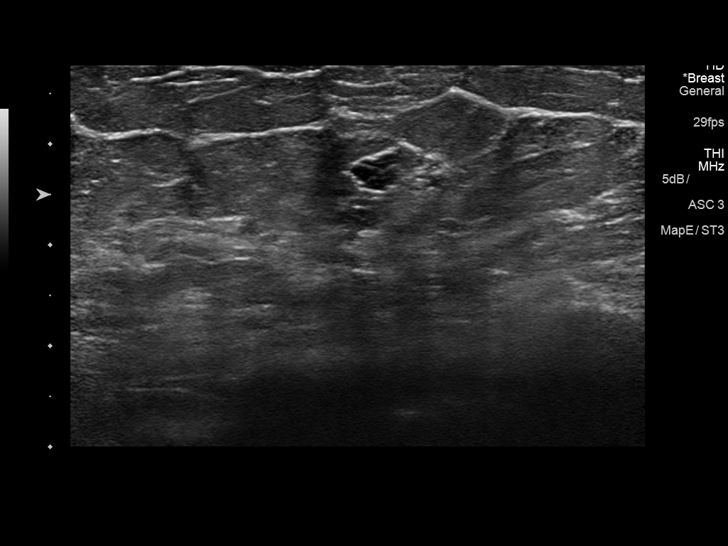

[13 of 19 positions shown; findings below may reference images not displayed]

ACR Breast Density Category c: The breast tissue is heterogeneously
dense, which may obscure small masses.
FINDINGS: A radiopaque BB was placed at the site of the patient's palpable
abnormality in the upper outer quadrant at middle depth on the right
breast. No focal mammographic findings are seen deep to the
radiopaque BB. A focal asymmetry in the medial right breast at
posterior depth appears waxing and waning from prior mammograms.
Appears circumscribed and gently lobulated.

No suspicious mammographic findings are identified on the left.

Mammographic images were processed with CAD.

Targeted ultrasound is performed, showing a multilobulated
heterogenous mass with cystic areas at the 3 o'clock position 6 cm
from the nipple on the right. Overall measurements are difficult due
to the shape of the mass, but it measures approximately 1.5 x 0.8 x
0.5 cm. There is internal vascularity. This correlates well with the
mammographic finding and likely represents a cluster of cysts.

Multiple other circumscribed hypoechoic masses, likely representing
complicated cysts are scattered throughout the lateral right breast
at the [DATE] position 3 cm from the nipple and 11 o'clock position 4
cm from the nipple. They measure 0.3 x 0.3 x 0.3 cm and 0.4 x 0.3 x
0.6 cm respectively. There is no significant internal vascularity.
No other suspicious findings corresponding to the patient's palpable
abnormality are identified.
IMPRESSION: 1. Probably benign cyst cluster corresponding to the patient's
medial right breast asymmetry. Recommendation is for six-month
mammographic and sonographic follow-up.
2. Probably benign, probable complicated cysts in the lateral right
breast. Recommendation is for six-month mammographic and sonographic
follow-up.
3. No focal mammographic or sonographic findings corresponding to
the patient's right breast palpable abnormality. Recommendation is
for clinical and symptomatic follow-up.
4. No mammographic evidence of malignancy on the left.

RECOMMENDATION:
Diagnostic right breast mammogram and ultrasound in 6 months.

Clinical and symptomatic follow-up for the patient's right breast
palpable abnormality.

I have discussed the findings and recommendations with the patient.
Results were also provided in writing at the conclusion of the
visit. If applicable, a reminder letter will be sent to the patient
regarding the next appointment.

BI-RADS CATEGORY  3: Probably benign.

## 2019-10-13 ENCOUNTER — Encounter: Payer: Self-pay | Admitting: Women's Health

## 2019-10-13 ENCOUNTER — Ambulatory Visit (INDEPENDENT_AMBULATORY_CARE_PROVIDER_SITE_OTHER): Payer: BC Managed Care – PPO | Admitting: Women's Health

## 2019-10-13 ENCOUNTER — Other Ambulatory Visit: Payer: Self-pay | Admitting: Women's Health

## 2019-10-13 VITALS — BP 123/85 | HR 67 | Ht 65.0 in | Wt 213.0 lb

## 2019-10-13 DIAGNOSIS — Z1329 Encounter for screening for other suspected endocrine disorder: Secondary | ICD-10-CM

## 2019-10-13 DIAGNOSIS — Z01419 Encounter for gynecological examination (general) (routine) without abnormal findings: Secondary | ICD-10-CM

## 2019-10-13 DIAGNOSIS — R232 Flushing: Secondary | ICD-10-CM | POA: Diagnosis not present

## 2019-10-13 DIAGNOSIS — R000 Tachycardia, unspecified: Secondary | ICD-10-CM

## 2019-10-13 MED ORDER — PAROXETINE MESYLATE 7.5 MG PO CAPS: 1.0000 | ORAL_CAPSULE | Freq: Every day | ORAL | 6 refills | Status: DC

## 2019-10-13 MED ORDER — PAROXETINE HCL 10 MG PO TABS: 10.0000 mg | ORAL_TABLET | Freq: Every day | ORAL | 6 refills | Status: DC

## 2019-10-13 NOTE — Progress Notes (Signed)
WELL-WOMAN EXAMINATION Patient name: Abigail Ingram MRN [id number]  Date of birth: 31-May-1970 Chief Complaint:   Gynecologic Exam  History of Present Illness:   Abigail Ingram is a 49 y.o. G85P2000 Caucasian female being seen today for a routine well-woman exam.  Current complaints: not sleeping well d/t frequent hot flashes, sometimes can be up to 5x/night, wakes up and feels like heart is pounding and fast. Has hot flashes during day as well, but doesn't have tachycardia w/ daytime hotflashes. Some increased joint pain, has always had knee pain, but now starting to hurt in hips.   Depression screen [facility] 2/9 10/13/2019 10/11/2017  Decreased Interest 1 0  Down, Depressed, Hopeless 0 0  PHQ - 2 Score 1 0  Altered sleeping 3 3  Tired, decreased energy 2 1  Change in appetite 0 0  Feeling bad or failure about yourself  0 0  Trouble concentrating 1 0  Moving slowly or fidgety/restless 2 0  Suicidal thoughts 0 0  PHQ-9 Score 9 4  Difficult doing work/chores - Not difficult at all     PCP: Robbie Lis      does not desire labs No LMP recorded. (Menstrual status: IUD). The current method of family planning is IUD. Liletta placed April 2016 Last pap 10/11/17. Results were: normal. H/O abnormal pap: no Last mammogram: 09/18/17. Results were: abnormal bi-rads 3, probably benign cyst cluster, recommended f/u - pt didn't go b/c of large bill she got. She states area hasn't changed, checks it frequently. Family h/o breast cancer: yes aunt Last colonoscopy: never. Results were: N/A. Family h/o colorectal cancer: yes MGM Review of Systems:   Pertinent items are noted in HPI Denies any headaches, blurred vision, fatigue, shortness of breath, chest pain, abdominal pain, abnormal vaginal discharge/itching/odor/irritation, problems with periods, bowel movements, urination, or intercourse unless otherwise stated above. Pertinent History Reviewed:  Reviewed past medical,surgical, social and  family history.  Reviewed problem list, medications and allergies. Physical Assessment:   Vitals:   10/13/19 0845  BP: 123/85  Pulse: 67  Weight: 213 lb (96.6 kg)  Height: 5\' 5"  (1.651 m)  Body mass index is 35.45 kg/m.        Physical Examination:   General appearance - well appearing, and in no distress  Mental status - alert, oriented to person, place, and time  Psych:  She has a normal mood and affect  Skin - warm and dry, normal color, no suspicious lesions noted  Chest - effort normal, all lung fields clear to auscultation bilaterally  Heart - normal rate and regular rhythm  Neck:  midline trachea, no thyromegaly or nodules  Breasts - Lt breasts appear normal, no suspicious masses, no skin or nipple changes or axillary nodes, RT: non-tender abnormalities felt corresponding w/ 2019 mammo/ultrasound at around 9-10 o'clock ~2-68fb from nipple, as well as 3 o'clock about 3-5fb from nipple  Abdomen - soft, nontender, nondistended, no masses or organomegaly  Pelvic - VULVA: normal appearing vulva with no masses, tenderness or lesions  VAGINA: normal appearing vagina with normal color and discharge, no lesions  CERVIX: normal appearing cervix without discharge or lesions, no CMT, IUD strings visible  Thin prep pap is not done  UTERUS: uterus is felt to be normal size, shape, consistency and nontender   ADNEXA: No adnexal masses or tenderness noted.  Rectal - multiple external non-thrombosed hemorrhoids, one pedunculated- pt states doesn't bother her  Extremities:  No swelling or varicosities noted  Chaperone: 8f  No results found for this or any previous visit (from the past 24 hour(s)).  Assessment & Plan:  1) Well-Woman Exam  2) Hot flashes w/ tachycardia> will check TSH, but likely perimenopausal vasomotor sx. Rx Brisdelle, f/u  3) Multiple cysts Rt breast> call to schedule mammogram  Labs/procedures today: exam  Mammogram-call to schedule Colonoscopy-call  to schedule  Orders Placed This Encounter  Procedures  . TSH    Meds:  Meds ordered this encounter  Medications  . PARoxetine Mesylate (BRISDELLE) 7.5 MG CAPS    Sig: Take 1 capsule by mouth at bedtime.    Dispense:  30 capsule    Refill:  6    Order Specific Question:   Supervising Provider    Answer:   Lazaro Arms [[id number]]    Follow-up: Return in about 3 months (around 01/13/2020) for F/U, with me.  Cheral Marker CNM, [facility] 10/13/2019 9:44 AM

## 2019-11-04 ENCOUNTER — Ambulatory Visit: Payer: BC Managed Care – PPO | Admitting: Gastroenterology

## 2019-12-14 ENCOUNTER — Other Ambulatory Visit: Payer: Self-pay | Admitting: Nurse Practitioner

## 2019-12-14 DIAGNOSIS — K219 Gastro-esophageal reflux disease without esophagitis: Secondary | ICD-10-CM

## 2019-12-14 DIAGNOSIS — R197 Diarrhea, unspecified: Secondary | ICD-10-CM

## 2019-12-14 DIAGNOSIS — R109 Unspecified abdominal pain: Secondary | ICD-10-CM

## 2019-12-29 ENCOUNTER — Telehealth: Payer: Self-pay | Admitting: *Deleted

## 2019-12-29 ENCOUNTER — Telehealth (INDEPENDENT_AMBULATORY_CARE_PROVIDER_SITE_OTHER): Payer: BC Managed Care – PPO | Admitting: Gastroenterology

## 2019-12-29 ENCOUNTER — Other Ambulatory Visit: Payer: Self-pay

## 2019-12-29 ENCOUNTER — Encounter: Payer: Self-pay | Admitting: Gastroenterology

## 2019-12-29 DIAGNOSIS — R109 Unspecified abdominal pain: Secondary | ICD-10-CM | POA: Diagnosis not present

## 2019-12-29 DIAGNOSIS — K2100 Gastro-esophageal reflux disease with esophagitis, without bleeding: Secondary | ICD-10-CM | POA: Diagnosis not present

## 2019-12-29 DIAGNOSIS — K582 Mixed irritable bowel syndrome: Secondary | ICD-10-CM

## 2019-12-29 DIAGNOSIS — R197 Diarrhea, unspecified: Secondary | ICD-10-CM

## 2019-12-29 DIAGNOSIS — K219 Gastro-esophageal reflux disease without esophagitis: Secondary | ICD-10-CM | POA: Diagnosis not present

## 2019-12-29 MED ORDER — SUCRALFATE 1 G PO TABS: ORAL_TABLET | ORAL | 3 refills | Status: DC

## 2019-12-29 NOTE — Addendum Note (Signed)
Addended by: Tiffany Kocher on: 12/29/2019 08:33 AM   Modules accepted: Orders

## 2019-12-29 NOTE — Telephone Encounter (Signed)
Celesta Aver, you are scheduled for a virtual visit with your provider today.  Just as we do with appointments in the office, we must obtain your consent to participate.  Your consent will be active for this visit and any virtual visit you may have with one of our providers in the next 365 days.  If you have a MyChart account, I can also send a copy of this consent to you electronically.  All virtual visits are billed to your insurance company just like a traditional visit in the office.  As this is a virtual visit, video technology does not allow for your provider to perform a traditional examination.  This may limit your provider's ability to fully assess your condition.  If your provider identifies any concerns that need to be evaluated in person or the need to arrange testing such as labs, EKG, etc, we will make arrangements to do so.  Although advances in technology are sophisticated, we cannot ensure that it will always work on either your end or our end.  If the connection with a video visit is poor, we may have to switch to a telephone visit.  With either a video or telephone visit, we are not always able to ensure that we have a secure connection.   I need to obtain your verbal consent now.   Are you willing to proceed with your visit today?

## 2019-12-29 NOTE — Progress Notes (Signed)
Primary Care Physician:  Elfredia Nevins, MD Primary GI:  Hennie Duos. Marletta Lor, DO   Patient Location: Home  Provider Location: [facility] office  Reason for Visit:  Chief Complaint  Patient presents with  . Follow-up    Abigail Ingram, not having any problems     Persons present on the virtual encounter, with roles: Patient, myself (provider),Angela Stallings CMA (updated meds and allergies)  Total time (minutes) spent on medical discussion: 10 minutes  Due to COVID-19, visit was conducted using Mychart video method.  Visit was requested by patient.  Virtual Visit via Mychart video  I connected with Christie Viscomi on 12/29/19 at  8:00 AM EDT by Mychart video and verified that I am speaking with the correct person using two identifiers.   I discussed the limitations, risks, security and privacy concerns of performing an evaluation and management service by telephone/video and the availability of in person appointments. I also discussed with the patient that there may be a patient responsible charge related to this service. The patient expressed understanding and agreed to proceed.   HPI:   Abigail Ingram is a 49 y.o. female who presents for virtual visit regarding IBS/GERD.  Last seen December 2020.  She is due for her first ever colonoscopy.  Maternal grandmother had colon cancer.  H/O esophagitis on EGD in 2015. Previously failed omeprazole, Nexium.  Dexilant caused diarrhea. Historically has controlled her GERD with Carafate 1-2 times daily as needed.  8/24 diagnosed with Covid. Has had some N/V/D associated with Covid but bowels are getting back to normal. No abdominal pain. No melena, brbpr. Heartburn has been a lot better. Has taken Carafate only once or twice for reflux in the past one month. She would like to have her first ever screening colonoscopy this summer when she is out of work.     Current Outpatient Medications  Medication Sig Dispense Refill  . Ginger, Zingiber  officinalis, (GINGER ROOT PO) Take by mouth as needed.    . Multiple Vitamin (MULTIVITAMIN) tablet Take 1 tablet by mouth daily.     . sucralfate (CARAFATE) 1 g tablet TAKE 1 TABLET(1 GRAM) BY MOUTH FOUR TIMES DAILY AT BEDTIME WITH MEALS AS NEEDED (Patient taking differently: as needed. TAKE 1 TABLET(1 GRAM) BY MOUTH FOUR TIMES DAILY AT BEDTIME WITH MEALS AS NEEDED) 120 tablet 3   No current facility-administered medications for this visit.    Past Medical History:  Diagnosis Date  . Breast nodule    Right   . GERD (gastroesophageal reflux disease)   . Gestational diabetes   . IBS (irritable bowel syndrome)     Past Surgical History:  Procedure Laterality Date  . CHOLECYSTECTOMY    . ESOPHAGOGASTRODUODENOSCOPY  2015  . kidney valve repair     . WISDOM TOOTH EXTRACTION      Family History  Problem Relation Age of Onset  . Cancer Other        breast  . Deep vein thrombosis Mother        + for inherited clotting disorder, on coumadin  . Cancer Father        skin  . Cancer Sister        skin  . Colon cancer Maternal Grandmother        over age 64, colon   . Cancer Paternal Grandmother        skin  . Diabetes Other   . Hypertension Other   . Stroke Other   .  Mental illness Other     Social History   Socioeconomic History  . Marital status: Married    Spouse name: Not on file  . Number of children: Not on file  . Years of education: Not on file  . Highest education level: Not on file  Occupational History  . Not on file  Tobacco Use  . Smoking status: Never Smoker  . Smokeless tobacco: Never Used  Vaping Use  . Vaping Use: Never used  Substance and Sexual Activity  . Alcohol use: Yes    Comment: ocassional  . Drug use: No  . Sexual activity: Yes    Birth control/protection: I.U.D.  Other Topics Concern  . Not on file  Social History Narrative  . Not on file   Social Determinants of Health   Financial Resource Strain: Low Risk   . Difficulty of Paying  Living Expenses: Not hard at all  Food Insecurity: No Food Insecurity  . Worried About Programme researcher, broadcasting/film/video in the Last Year: Never true  . Ran Out of Food in the Last Year: Never true  Transportation Needs: No Transportation Needs  . Lack of Transportation (Medical): No  . Lack of Transportation (Non-Medical): No  Physical Activity: Insufficiently Active  . Days of Exercise per Week: 3 days  . Minutes of Exercise per Session: 30 min  Stress: Stress Concern Present  . Feeling of Stress : Rather much  Social Connections: Moderately Integrated  . Frequency of Communication with Friends and Family: More than three times a week  . Frequency of Social Gatherings with Friends and Family: Once a week  . Attends Religious Services: Never  . Active Member of Clubs or Organizations: Yes  . Attends Banker Meetings: More than 4 times per year  . Marital Status: Married  Catering manager Violence: Not At Risk  . Fear of Current or Ex-Partner: No  . Emotionally Abused: No  . Physically Abused: No  . Sexually Abused: No      ROS:  General: Negative for anorexia, weight loss, fever, chills, fatigue, weakness. Eyes: Negative for vision changes.  ENT: Negative for hoarseness, difficulty swallowing , nasal congestion. CV: Negative for chest pain, angina, palpitations, dyspnea on exertion, peripheral edema.  Respiratory: Negative for dyspnea at rest, dyspnea on exertion, cough, sputum, wheezing.  GI: See history of present illness. GU:  Negative for dysuria, hematuria, urinary incontinence, urinary frequency, nocturnal urination.  MS: Negative for joint pain, low back pain.  Derm: Negative for rash or itching.  Neuro: Negative for weakness, abnormal sensation, seizure, frequent headaches, memory loss, confusion.  Psych: Negative for anxiety, depression, suicidal ideation, hallucinations.  Endo: Negative for unusual weight change.  Heme: Negative for bruising or bleeding. Allergy:  Negative for rash or hives.   Observations/Objective: Pleasant well nourished well developed female in NAD. Otherwise exam not available.  Assessment and Plan: 49 y/o female with GERD, IBS. Doing well at this time. Recovering from COVID last month. Reflux has been better. Bowels returning to normal since COVID. Using carafate rarely but would like to have rx on hand.   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 Advised her to call in to request at least couple of months prior to when she would like to have it done. If she is not having any problems, she would be an acceptable triage barring any new medical history changes, currently ASA II.   Follow Up Instructions:  I discussed the assessment and treatment plan with the patient. The patient was provided an opportunity to ask questions and all were answered. The patient agreed with the plan and demonstrated an understanding of the instructions. AVS mailed to patient's home address.   The patient was advised to call back or seek an in-person evaluation if the symptoms worsen or if the condition fails to improve as anticipated.  I provided 10 minutes of virtual face-to-face time during this encounter.   Tana Coast, PA-C

## 2019-12-29 NOTE — Telephone Encounter (Signed)
Pt consented to a virtual visit. 

## 2019-12-29 NOTE — Patient Instructions (Signed)
1. Carafate RX sent to your pharmacy. 2. Please call when you are ready to schedule your screening colonoscopy. Generally allow 2-3 months from the time you call for colonoscopy until your procedure date.  3. Return office visit in 2 years for reflux, IBS or sooner if needed.

## 2020-05-24 ENCOUNTER — Encounter: Payer: Self-pay | Admitting: Internal Medicine

## 2020-08-16 ENCOUNTER — Ambulatory Visit: Payer: BC Managed Care – PPO | Admitting: Women's Health

## 2020-08-17 ENCOUNTER — Other Ambulatory Visit: Payer: Self-pay

## 2020-08-17 ENCOUNTER — Ambulatory Visit: Payer: Managed Care, Other (non HMO) | Admitting: Adult Health

## 2020-08-17 ENCOUNTER — Encounter: Payer: Self-pay | Admitting: Adult Health

## 2020-08-17 VITALS — BP 137/77 | HR 55 | Ht 65.0 in | Wt 223.0 lb

## 2020-08-17 DIAGNOSIS — Z30011 Encounter for initial prescription of contraceptive pills: Secondary | ICD-10-CM | POA: Diagnosis not present

## 2020-08-17 DIAGNOSIS — Z975 Presence of (intrauterine) contraceptive device: Secondary | ICD-10-CM

## 2020-08-17 MED ORDER — NORETHINDRONE 0.35 MG PO TABS: 1.0000 | ORAL_TABLET | Freq: Every day | ORAL | 11 refills | Status: DC

## 2020-08-17 NOTE — Progress Notes (Signed)
  Subjective:     Patient ID: Abigail Ingram, female   DOB: 07/24/1970, 50 y.o.   MRN: [id number]  HPI Abigail Ingram is a 50 year old white female, married, G2P2 in to discuss IUD removal, she had liletta placed 07/30/14 and does not have periods. Her pain has clotting disorder and had DVT, and she was told COC.  PCP is Dr Sherwood Gambler.   Review of Systems No periods with IUD Has some hot flashes  Reviewed past medical,surgical, social and family history. Reviewed medications and allergies.     Objective:   Physical Exam BP 137/77 (BP Location: Right Arm, Patient Position: Sitting, Cuff Size: Normal)   Pulse (!) 55   Ht 5\' 5"  (1.651 m)   Wt 223 lb (101.2 kg)   BMI 37.11 kg/m  Skin warm and dry. Neck: mid line trachea, normal thyroid, good ROM, no lymphadenopathy noted. Lungs: clear to ausculation bilaterally. Cardiovascular: regular rate and rhythm.     Upstream - 08/17/20 1351      Pregnancy Intention Screening   Does the patient want to become pregnant in the next year? No    Does the patient's partner want to become pregnant in the next year? No    Would the patient like to discuss contraceptive options today? No      Contraception Wrap Up   Current Method IUD or IUS    End Method IUD or IUS   POP   Contraception Counseling Provided No          Assessment:     1. Encounter for initial prescription of contraceptive pills Will rx micronor Meds ordered this encounter  Medications  . norethindrone (MICRONOR) 0.35 MG tablet    Sig: Take 1 tablet (0.35 mg total) by mouth daily.    Dispense:  28 tablet    Refill:  11    Order Specific Question:   Supervising Provider    Answer:   08/19/20 H [[id number]]    2. IUD (intrauterine device) in place Will leave IUD in for now and add POP    Plan:     Has pap and physical scheduled for 10/13/20, may remove and reinsert IUD then, but hoping liletta approved for 8 years by then

## 2020-10-13 ENCOUNTER — Ambulatory Visit (INDEPENDENT_AMBULATORY_CARE_PROVIDER_SITE_OTHER): Payer: Self-pay | Admitting: Adult Health

## 2020-10-13 ENCOUNTER — Other Ambulatory Visit: Payer: Self-pay

## 2020-10-13 ENCOUNTER — Encounter: Payer: Self-pay | Admitting: Adult Health

## 2020-10-13 ENCOUNTER — Other Ambulatory Visit (HOSPITAL_COMMUNITY)
Admission: RE | Admit: 2020-10-13 | Discharge: 2020-10-13 | Disposition: A | Discharge status: Home / Self Care | Payer: Managed Care, Other (non HMO) | Source: Ambulatory Visit | Attending: Adult Health | Admitting: Adult Health

## 2020-10-13 VITALS — BP 118/73 | HR 67 | Ht 65.0 in | Wt 223.0 lb

## 2020-10-13 DIAGNOSIS — Z1211 Encounter for screening for malignant neoplasm of colon: Secondary | ICD-10-CM | POA: Insufficient documentation

## 2020-10-13 DIAGNOSIS — R232 Flushing: Secondary | ICD-10-CM

## 2020-10-13 DIAGNOSIS — N951 Menopausal and female climacteric states: Secondary | ICD-10-CM | POA: Insufficient documentation

## 2020-10-13 DIAGNOSIS — Z01419 Encounter for gynecological examination (general) (routine) without abnormal findings: Secondary | ICD-10-CM

## 2020-10-13 DIAGNOSIS — Z30432 Encounter for removal of intrauterine contraceptive device: Secondary | ICD-10-CM | POA: Insufficient documentation

## 2020-10-13 LAB — HEMOCCULT GUIAC POC 1CARD (OFFICE): Fecal Occult Blood, POC: NEGATIVE

## 2020-10-13 NOTE — Progress Notes (Signed)
Patient ID: Abigail Ingram, female   DOB: [birth year], 50 y.o.   MRN: [id number] History of Present Illness: Abigail Ingram is a 50 year old white female,married, G2P2 in for a well woman gyn exam and pap. She has liletta  IUD that has expired and is not having periods and has hot flashes, night sweats, moody and sleep disturbance.  Will remove IUD and use condoms and check FSH in 4 weeks.  Lab Results  Component Value Date   DIAGPAP  10/11/2017    NEGATIVE FOR INTRAEPITHELIAL LESIONS OR MALIGNANCY.   HPV NOT DETECTED 10/11/2017    PCP is Dr Sherwood Gambler..  Current Medications, Allergies, Past Medical History, Past Surgical History, Family History and Social History were reviewed in [employer] electronic medical record.     Review of Systems:  Patient denies any headaches, hearing loss, fatigue, blurred vision, shortness of breath, chest pain, abdominal pain, problems with bowel movements, urination, or intercourse. No joint pain. See HPI for positives.   Physical Exam:BP 118/73 (BP Location: Left Arm, Patient Position: Sitting, Cuff Size: Large)   Pulse 67   Ht 5\' 5"  (1.651 m)   Wt 223 lb (101.2 kg)   LMP  (LMP Unknown)   BMI 37.11 kg/m  Consent signed for IUD removal.  General:  Well developed, well nourished, no acute distress Skin:  Warm and dry Neck:  Midline trachea, normal thyroid, good ROM, no lymphadenopathy Lungs; Clear to auscultation bilaterally Breast:  No dominant palpable mass, retraction, or nipple discharge Cardiovascular: Regular rate and rhythm Abdomen:  Soft, non tender, no hepatosplenomegaly Pelvic:  External genitalia is normal in appearance, no lesions.  The vagina is normal in appearance. Urethra has no lesions or masses. The cervix is bulbous.Pap with HR HPV genotyping performed, +IUD strings at os, grasped with forceps and asked to cough and IUD easily removed.   Uterus is felt to be normal size, shape, and contour.  No adnexal masses or tenderness  noted.Bladder is non tender, no masses felt. Rectal: Good sphincter tone, no polyps, or hemorrhoids felt.  Hemoccult negative. Extremities/musculoskeletal:  No swelling or varicosities noted, no clubbing or cyanosis Psych:  No mood changes, alert and cooperative,seems happy AA is 2 Fall risk is low Depression screen [place] 2/9 10/13/2020 08/17/2020 10/13/2019  Decreased Interest 1 0 1  Down, Depressed, Hopeless 0 0 0  PHQ - 2 Score 1 0 1  Altered sleeping 3 - 3  Tired, decreased energy 2 - 2  Change in appetite 0 - 0  Feeling bad or failure about yourself  0 - 0  Trouble concentrating 1 - 1  Moving slowly or fidgety/restless 0 - 2  Suicidal thoughts 0 - 0  PHQ-9 Score 7 - 9  Difficult doing work/chores - - -    GAD 7 : Generalized Anxiety Score 10/13/2020 10/13/2019  Nervous, Anxious, on Edge 1 1  Control/stop worrying 1 1  Worry too much - different things 1 1  Trouble relaxing 1 3  Restless 1 2  Easily annoyed or irritable 0 1  Afraid - awful might happen 0 0  Total GAD 7 Score 5 9      Upstream - 10/13/20 1537       Pregnancy Intention Screening   Does the patient want to become pregnant in the next year? No    Does the patient's partner want to become pregnant in the next year? No    Would the patient like to discuss contraceptive options today? No  Contraception Wrap Up   Current Method IUD or IUS    End Method Female Condom    Contraception Counseling Provided No             Pt gave verbal consent for exam with chaperone.  Impression and Plan: 1. Encounter for gynecological examination with Papanicolaou smear of cervix Pap sent Physical in 1 year  Pap in 3 if normal She mammogram in [place]. - CBC - Comprehensive metabolic panel - TSH - Lipid panel - Cytology - PAP( [facility])  2. Encounter for screening fecal occult blood testing  - POCT occult blood stool  3. Perimenopause Check in 4 weeks  - Follicle stimulating hormone  4. Hot flashes  -  Follicle stimulating hormone  5. Encounter for IUD removal

## 2020-10-19 ENCOUNTER — Encounter: Payer: Self-pay | Admitting: Women's Health

## 2020-10-19 DIAGNOSIS — R87619 Unspecified abnormal cytological findings in specimens from cervix uteri: Secondary | ICD-10-CM | POA: Insufficient documentation

## 2020-10-19 LAB — CYTOLOGY - PAP
Comment: NEGATIVE
Diagnosis: UNDETERMINED — AB
High risk HPV: NEGATIVE

## 2020-10-20 ENCOUNTER — Telehealth: Payer: Self-pay

## 2020-10-20 NOTE — Telephone Encounter (Signed)
Called pt to inform her of pap smear results. Pt confirmed understanding.

## 2020-10-27 ENCOUNTER — Telehealth: Payer: Self-pay | Admitting: Adult Health

## 2020-10-27 NOTE — Telephone Encounter (Signed)
Pt had IUD removed 2 weeks ago. Pt is having hot flashes, like 5-6 a night. Pt states you wanted her to call with an update. Pt wants to talk to you. Thanks! JSY

## 2020-10-27 NOTE — Telephone Encounter (Signed)
Pt having night sweats, moody and anxious at times, has some paxil at home, restart that, will get [facility] in about 2 weeks. This is menopausal symptoms.

## 2020-10-27 NOTE — Telephone Encounter (Signed)
Pt requesting a call back from [place]  States she's "having issues" (no other information given)

## 2020-10-27 NOTE — Telephone Encounter (Signed)
Left message I called,call me back.  

## 2020-11-12 ENCOUNTER — Telehealth: Payer: Self-pay | Admitting: Adult Health

## 2020-11-12 LAB — COMPREHENSIVE METABOLIC PANEL
ALT: 23 IU/L (ref 0–32)
AST: 19 IU/L (ref 0–40)
Albumin/Globulin Ratio: 2.2 (ref 1.2–2.2)
Albumin: 4.8 g/dL (ref 3.8–4.8)
Alkaline Phosphatase: 56 IU/L (ref 44–121)
BUN/Creatinine Ratio: 18 (ref 9–23)
BUN: 14 mg/dL (ref 6–24)
Bilirubin Total: 0.3 mg/dL (ref 0.0–1.2)
CO2: 26 mmol/L (ref 20–29)
Calcium: 9.4 mg/dL (ref 8.7–10.2)
Chloride: 102 mmol/L (ref 96–106)
Creatinine, Ser: 0.8 mg/dL (ref 0.57–1.00)
Globulin, Total: 2.2 g/dL (ref 1.5–4.5)
Glucose: 86 mg/dL (ref 65–99)
Potassium: 4.4 mmol/L (ref 3.5–5.2)
Sodium: 143 mmol/L (ref 134–144)
Total Protein: 7 g/dL (ref 6.0–8.5)
eGFR: 90 mL/min/{1.73_m2} (ref >59)

## 2020-11-12 LAB — LIPID PANEL
Chol/HDL Ratio: 4.1 ratio (ref 0.0–4.4)
Cholesterol, Total: 225 mg/dL — ABNORMAL HIGH (ref 100–199)
HDL: 55 mg/dL (ref >39)
LDL Chol Calc (NIH): 132 mg/dL — ABNORMAL HIGH (ref 0–99)
Triglycerides: 212 mg/dL — ABNORMAL HIGH (ref 0–149)
VLDL Cholesterol Cal: 38 mg/dL (ref 5–40)

## 2020-11-12 LAB — CBC
Hematocrit: 42.1 % (ref 34.0–46.6)
Hemoglobin: 13.9 g/dL (ref 11.1–15.9)
MCH: 28.5 pg (ref 26.6–33.0)
MCHC: 33 g/dL (ref 31.5–35.7)
MCV: 86 fL (ref 79–97)
Platelets: 183 10*3/uL (ref 150–450)
RBC: 4.87 x10E6/uL (ref 3.77–5.28)
RDW: 13 % (ref 11.7–15.4)
WBC: 8.2 10*3/uL (ref 3.4–10.8)

## 2020-11-12 LAB — TSH: TSH: 2.23 u[IU]/mL (ref 0.450–4.500)

## 2020-11-12 LAB — FOLLICLE STIMULATING HORMONE: FSH: 73.5 m[IU]/mL

## 2020-11-12 MED ORDER — PAROXETINE HCL 10 MG PO TABS: 10.0000 mg | ORAL_TABLET | Freq: Every day | ORAL | 2 refills | Status: DC

## 2020-11-12 NOTE — Telephone Encounter (Signed)
Pt.aware of labs, try omega 3's and watch fats and carbs, increase activity and will rx paxil 10 mg for hot flashes

## 2020-12-14 ENCOUNTER — Telehealth: Payer: Self-pay | Admitting: Adult Health

## 2020-12-14 ENCOUNTER — Other Ambulatory Visit: Payer: Self-pay | Admitting: Adult Health

## 2020-12-14 MED ORDER — SULFAMETHOXAZOLE-TRIMETHOPRIM 800-160 MG PO TABS: 1.0000 | ORAL_TABLET | Freq: Two times a day (BID) | ORAL | 0 refills | Status: DC

## 2020-12-14 NOTE — Telephone Encounter (Signed)
Pt aware that septra ds sent to in already

## 2020-12-14 NOTE — Progress Notes (Signed)
Rx septra ds for UTI 

## 2020-12-14 NOTE — Telephone Encounter (Signed)
Patient needs Victorino Dike to give her a call.

## 2021-01-05 ENCOUNTER — Other Ambulatory Visit: Payer: Self-pay | Admitting: Gastroenterology

## 2021-01-05 ENCOUNTER — Encounter: Payer: Self-pay | Admitting: Gastroenterology

## 2021-01-05 DIAGNOSIS — R197 Diarrhea, unspecified: Secondary | ICD-10-CM

## 2021-01-05 DIAGNOSIS — K219 Gastro-esophageal reflux disease without esophagitis: Secondary | ICD-10-CM

## 2021-01-05 DIAGNOSIS — R109 Unspecified abdominal pain: Secondary | ICD-10-CM

## 2021-01-05 NOTE — Telephone Encounter (Signed)
Patient needs ov.  

## 2021-02-08 ENCOUNTER — Other Ambulatory Visit: Payer: Self-pay | Admitting: Adult Health

## 2021-09-12 ENCOUNTER — Other Ambulatory Visit: Payer: Self-pay | Admitting: Adult Health

## 2022-05-08 DIAGNOSIS — N951 Menopausal and female climacteric states: Secondary | ICD-10-CM | POA: Diagnosis not present

## 2022-07-04 DIAGNOSIS — Z7689 Persons encountering health services in other specified circumstances: Secondary | ICD-10-CM | POA: Diagnosis not present

## 2022-07-04 DIAGNOSIS — Z780 Asymptomatic menopausal state: Secondary | ICD-10-CM | POA: Diagnosis not present

## 2022-07-04 DIAGNOSIS — Z1330 Encounter for screening examination for mental health and behavioral disorders, unspecified: Secondary | ICD-10-CM | POA: Diagnosis not present

## 2022-07-04 DIAGNOSIS — R519 Headache, unspecified: Secondary | ICD-10-CM | POA: Diagnosis not present

## 2022-07-04 DIAGNOSIS — R42 Dizziness and giddiness: Secondary | ICD-10-CM | POA: Diagnosis not present

## 2022-07-05 DIAGNOSIS — G43009 Migraine without aura, not intractable, without status migrainosus: Secondary | ICD-10-CM | POA: Diagnosis not present

## 2022-07-05 DIAGNOSIS — R42 Dizziness and giddiness: Secondary | ICD-10-CM | POA: Diagnosis not present

## 2022-07-20 DIAGNOSIS — N951 Menopausal and female climacteric states: Secondary | ICD-10-CM | POA: Diagnosis not present

## 2022-07-21 DIAGNOSIS — R519 Headache, unspecified: Secondary | ICD-10-CM | POA: Diagnosis not present

## 2022-07-21 DIAGNOSIS — R42 Dizziness and giddiness: Secondary | ICD-10-CM | POA: Diagnosis not present

## 2022-07-21 DIAGNOSIS — R9082 White matter disease, unspecified: Secondary | ICD-10-CM | POA: Diagnosis not present

## 2022-07-21 DIAGNOSIS — G43009 Migraine without aura, not intractable, without status migrainosus: Secondary | ICD-10-CM | POA: Diagnosis not present

## 2022-11-30 DIAGNOSIS — H81399 Other peripheral vertigo, unspecified ear: Secondary | ICD-10-CM | POA: Diagnosis not present

## 2022-11-30 DIAGNOSIS — G43009 Migraine without aura, not intractable, without status migrainosus: Secondary | ICD-10-CM | POA: Diagnosis not present

## 2023-05-22 DIAGNOSIS — Z6838 Body mass index (BMI) 38.0-38.9, adult: Secondary | ICD-10-CM | POA: Diagnosis not present

## 2023-05-22 DIAGNOSIS — R030 Elevated blood-pressure reading, without diagnosis of hypertension: Secondary | ICD-10-CM | POA: Diagnosis not present

## 2023-05-22 DIAGNOSIS — J101 Influenza due to other identified influenza virus with other respiratory manifestations: Secondary | ICD-10-CM | POA: Diagnosis not present

## 2023-05-22 DIAGNOSIS — H9209 Otalgia, unspecified ear: Secondary | ICD-10-CM | POA: Diagnosis not present

## 2023-06-08 DIAGNOSIS — M7051 Other bursitis of knee, right knee: Secondary | ICD-10-CM | POA: Diagnosis not present

## 2023-06-08 DIAGNOSIS — R52 Pain, unspecified: Secondary | ICD-10-CM | POA: Diagnosis not present

## 2023-08-07 ENCOUNTER — Encounter: Payer: Self-pay | Admitting: Adult Health

## 2023-08-07 ENCOUNTER — Other Ambulatory Visit (HOSPITAL_COMMUNITY)
Admission: RE | Admit: 2023-08-07 | Discharge: 2023-08-07 | Disposition: A | Discharge status: Home / Self Care | Source: Ambulatory Visit | Attending: Adult Health | Admitting: Adult Health

## 2023-08-07 ENCOUNTER — Ambulatory Visit: Admitting: Adult Health

## 2023-08-07 VITALS — BP 137/84 | HR 62 | Ht 65.0 in | Wt 236.0 lb

## 2023-08-07 DIAGNOSIS — Z01419 Encounter for gynecological examination (general) (routine) without abnormal findings: Secondary | ICD-10-CM | POA: Diagnosis not present

## 2023-08-07 DIAGNOSIS — Z1331 Encounter for screening for depression: Secondary | ICD-10-CM

## 2023-08-07 NOTE — Progress Notes (Signed)
 Patient ID: Abigail Ingram, female   DOB: October 17, 1970, 53 y.o.   MRN: [id number] History of Present Illness: Abigail Ingram is a 53 year old white female,married, PM in for a well woman gyn exam and pap. She has changed jobs and less stressed, and happy. She has seen MD at [facility] about menopause and is taking effexor and doing well, still occasional hot flash. Breasts are tender on and off. Can't seem to lose weight.   PCP is Dr Lewayne Records.   Current Medications, Allergies, Past Medical History, Past Surgical History, Family History and Social History were reviewed in [employer] electronic medical record.     Review of Systems: Patient denies any headaches, hearing loss, fatigue, blurred vision, shortness of breath, chest pain, abdominal pain, problems with bowel movements, urination, or intercourse. No joint pain(knees ache) or mood swings.  See HPI for positives  Denies any vaginal bleeding   Physical Exam:BP 137/84 (BP Location: Left Arm, Patient Position: Sitting, Cuff Size: Large)   Pulse 62   Ht 5\' 5"  (1.651 m)   Wt 236 lb (107 kg)   LMP  (LMP Unknown)   BMI 39.27 kg/m   General:  Well developed, well nourished, no acute distress Skin:  Warm and dry Neck:  Midline trachea, normal thyroid, good ROM, no lymphadenopathy Lungs; Clear to auscultation bilaterally Breast:  No dominant palpable mass, retraction, or nipple discharge Cardiovascular: Regular rate and rhythm Abdomen:  Soft, non tender, no hepatosplenomegaly Pelvic:  External genitalia is normal in appearance, no lesions.  The vagina is normal in appearance. Urethra has no lesions or masses. The cervix is smooth, pap with HR HPV genotyping performed.  Uterus is felt to be normal size, shape, and contour.  No adnexal masses or tenderness noted.Bladder is non tender, no masses felt. Rectal is deferred at her request Extremities/musculoskeletal:  No swelling or varicosities noted, no clubbing or cyanosis Psych:  No mood changes,  alert and cooperative,seems happy AA is 2 Fall risk is low    08/07/2023    3:49 PM 10/13/2020    3:26 PM 08/17/2020    1:51 PM  Depression screen PHQ 2/9  Decreased Interest 1 1 0  Down, Depressed, Hopeless 0 0 0  PHQ - 2 Score 1 1 0  Altered sleeping 1 3   Tired, decreased energy 2 2   Change in appetite 0 0   Feeling bad or failure about yourself  0 0   Trouble concentrating 0 1   Moving slowly or fidgety/restless 0 0   Suicidal thoughts 0 0   PHQ-9 Score 4 7        08/07/2023    3:49 PM 10/13/2020    3:26 PM 10/13/2019    8:59 AM  GAD 7 : Generalized Anxiety Score  Nervous, Anxious, on Edge 1 1 1   Control/stop worrying 0 1 1  Worry too much - different things 1 1 1   Trouble relaxing 1 1 3   Restless 0 1 2  Easily annoyed or irritable 2 0 1  Afraid - awful might happen 0 0 0  Total GAD 7 Score 5 5 9       Upstream - 08/07/23 1547       Pregnancy Intention Screening   Does the patient want to become pregnant in the next year? N/A    Does the patient's partner want to become pregnant in the next year? N/A    Would the patient like to discuss contraceptive options today? N/A  Contraception Wrap Up   Current Method No Method - Other Reason   PM   End Method No Method - Other Reason   PM   Contraception Counseling Provided No            Examination chaperoned by Alphonso Aschoff LPN   Impression and plan: 1. Encounter for gynecological examination with Papanicolaou smear of cervix (Primary) Pap sent Pap in 3 years Physical with PCP Labs with PCP Mammogram was negative at [facility] 4/89/25 Colonoscopy per GI  - Cytology - PAP( [place])

## 2023-08-10 LAB — CYTOLOGY - PAP
Comment: NEGATIVE
Diagnosis: NEGATIVE
High risk HPV: NEGATIVE

## 2023-09-04 DIAGNOSIS — N951 Menopausal and female climacteric states: Secondary | ICD-10-CM | POA: Diagnosis not present
